# Patient Record
Sex: Male | Born: 1960
Health system: Southern US, Community
[De-identification: ages and names within clinical notes are randomized; demographics above are authoritative.]

## PROBLEM LIST (undated history)

## (undated) DIAGNOSIS — S52202A Unspecified fracture of shaft of left ulna, initial encounter for closed fracture: Secondary | ICD-10-CM

## (undated) DIAGNOSIS — F329 Major depressive disorder, single episode, unspecified: Secondary | ICD-10-CM

## (undated) DIAGNOSIS — R0600 Dyspnea, unspecified: Secondary | ICD-10-CM

## (undated) DIAGNOSIS — I10 Essential (primary) hypertension: Secondary | ICD-10-CM

## (undated) DIAGNOSIS — J449 Chronic obstructive pulmonary disease, unspecified: Secondary | ICD-10-CM

## (undated) DIAGNOSIS — M67432 Ganglion, left wrist: Secondary | ICD-10-CM

## (undated) DIAGNOSIS — M67431 Ganglion, right wrist: Secondary | ICD-10-CM

## (undated) DIAGNOSIS — M5432 Sciatica, left side: Secondary | ICD-10-CM

## (undated) DIAGNOSIS — M1990 Unspecified osteoarthritis, unspecified site: Secondary | ICD-10-CM

## (undated) DIAGNOSIS — S41032A Puncture wound without foreign body of left shoulder, initial encounter: Secondary | ICD-10-CM

## (undated) DIAGNOSIS — T148XXA Other injury of unspecified body region, initial encounter: Secondary | ICD-10-CM

## (undated) DIAGNOSIS — K219 Gastro-esophageal reflux disease without esophagitis: Secondary | ICD-10-CM

## (undated) DIAGNOSIS — E785 Hyperlipidemia, unspecified: Secondary | ICD-10-CM

## (undated) DIAGNOSIS — M51369 Other intervertebral disc degeneration, lumbar region without mention of lumbar back pain or lower extremity pain: Secondary | ICD-10-CM

## (undated) DIAGNOSIS — M5030 Other cervical disc degeneration, unspecified cervical region: Secondary | ICD-10-CM

## (undated) DIAGNOSIS — F32A Depression, unspecified: Secondary | ICD-10-CM

## (undated) HISTORY — PX: HERNIA REPAIR: SHX51

## (undated) HISTORY — PX: CHOLECYSTECTOMY: SHX55

## (undated) HISTORY — PX: FRACTURE SURGERY: SHX138

## (undated) HISTORY — PX: HEMORROIDECTOMY: SUR656

---

## 2005-10-26 ENCOUNTER — Emergency Department: Payer: Self-pay | Admitting: Emergency Medicine

## 2005-11-23 ENCOUNTER — Ambulatory Visit: Payer: Self-pay | Admitting: Vascular Surgery

## 2005-11-23 ENCOUNTER — Other Ambulatory Visit: Payer: Self-pay

## 2005-11-30 ENCOUNTER — Ambulatory Visit: Payer: Self-pay | Admitting: Vascular Surgery

## 2006-09-10 ENCOUNTER — Ambulatory Visit: Payer: Self-pay | Admitting: Pain Medicine

## 2006-09-19 ENCOUNTER — Ambulatory Visit: Payer: Self-pay | Admitting: Pain Medicine

## 2006-10-02 ENCOUNTER — Encounter: Payer: Self-pay | Admitting: Pain Medicine

## 2006-10-03 ENCOUNTER — Ambulatory Visit: Payer: Self-pay | Admitting: Pain Medicine

## 2008-07-17 ENCOUNTER — Ambulatory Visit: Payer: Self-pay

## 2008-07-20 ENCOUNTER — Ambulatory Visit: Payer: Self-pay

## 2008-12-08 ENCOUNTER — Emergency Department: Payer: Self-pay | Admitting: Emergency Medicine

## 2010-08-08 ENCOUNTER — Emergency Department: Payer: Self-pay | Admitting: Emergency Medicine

## 2010-11-08 ENCOUNTER — Emergency Department: Payer: Self-pay | Admitting: Emergency Medicine

## 2011-02-11 ENCOUNTER — Emergency Department: Payer: Self-pay | Admitting: Internal Medicine

## 2011-03-30 ENCOUNTER — Ambulatory Visit: Payer: Self-pay | Admitting: Emergency Medicine

## 2011-03-31 ENCOUNTER — Ambulatory Visit: Payer: Self-pay | Admitting: Emergency Medicine

## 2012-08-20 ENCOUNTER — Ambulatory Visit: Payer: Self-pay | Admitting: Physical Medicine and Rehabilitation

## 2012-09-17 ENCOUNTER — Ambulatory Visit: Payer: Self-pay | Admitting: Internal Medicine

## 2012-09-24 IMAGING — CT CT ABD-PELV W/ CM
1 of 2 series · 15 of 32 positions shown, 19 images · IV contrast (isovue)
Comparison: none

REASON FOR EXAM: (1) LLQ pain; (2) LLQ pain
COMMENTS:

PROCEDURE:     CT  - CT ABDOMEN / PELVIS  W  - August 08, 2010 [DATE]
RESULT:     Comparison:  None.
TECHNIQUE: Multiple axial images of the abdomen and pelvis were performed
from the lung bases to the pubic symphysis, with p.o. contrast and with 100
mL of Isovue 370 intravenous contrast.

[Series 2: soft tissue · axial · 0.93mm/px · z∈[-6,+474]mm · 15 of 176 slices shown, 19 images]
[im 8/176  soft-tissue]
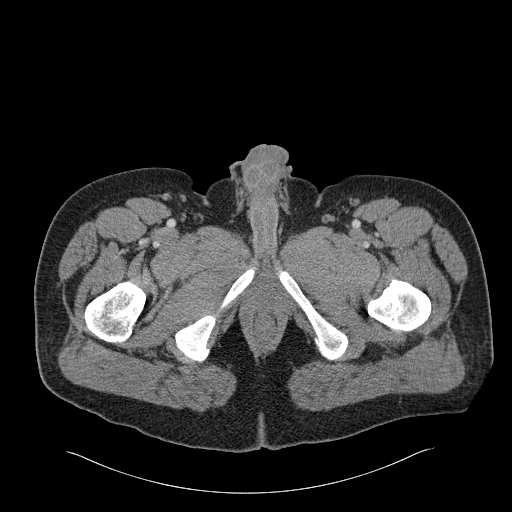
[im 8/176  bone]
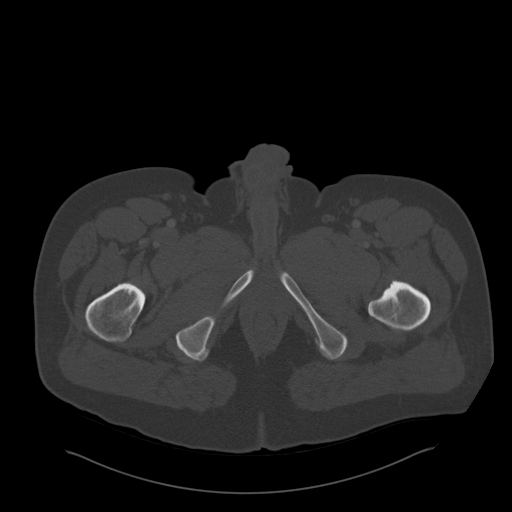
[im 23/176  soft-tissue]
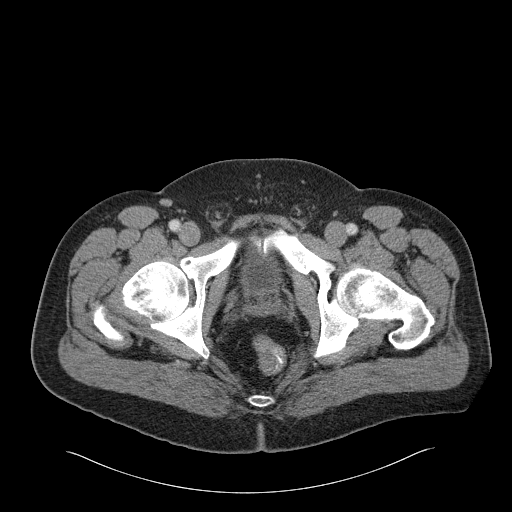
[im 39/176  soft-tissue]
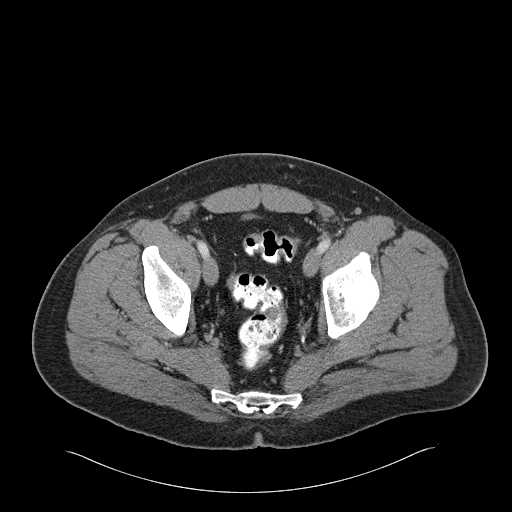
[im 46/176  soft-tissue]
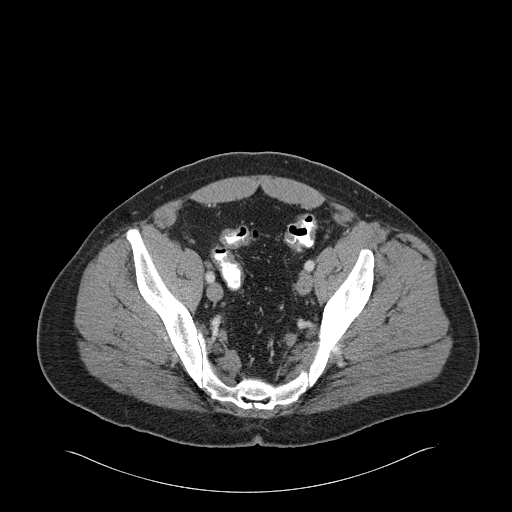
[im 61/176  soft-tissue]
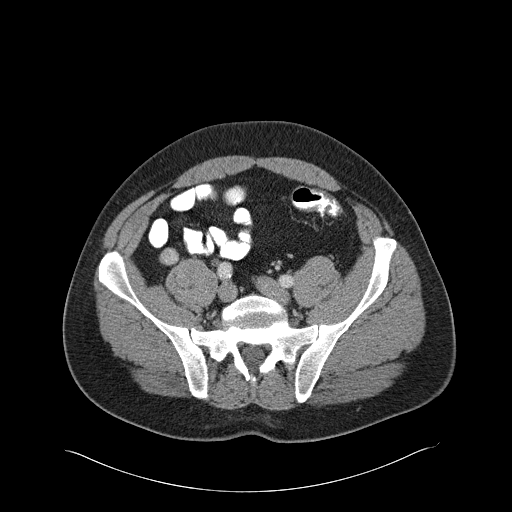
[im 77/176  soft-tissue]
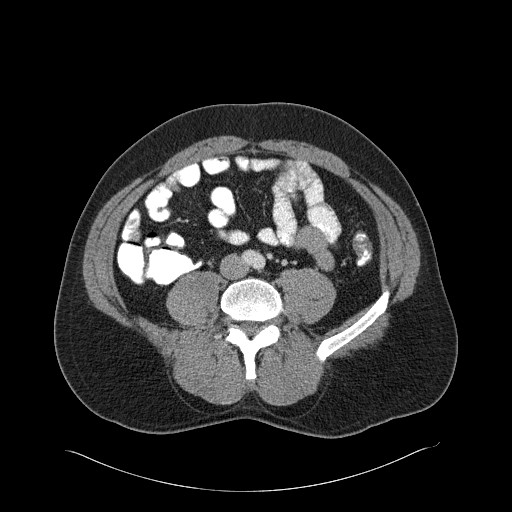
[im 92/176  soft-tissue]
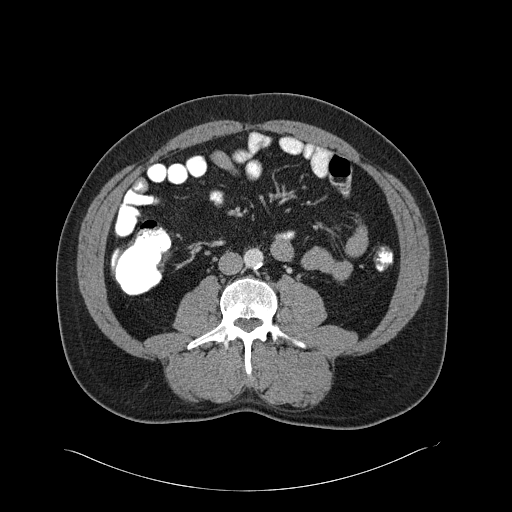
[im 99/176  soft-tissue]
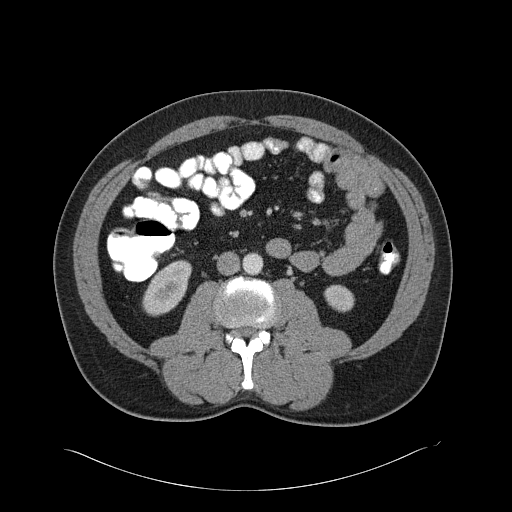
[im 115/176  soft-tissue]
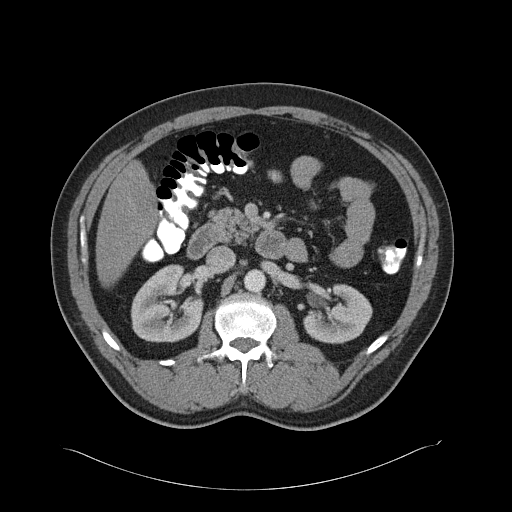
[im 115/176  bone]
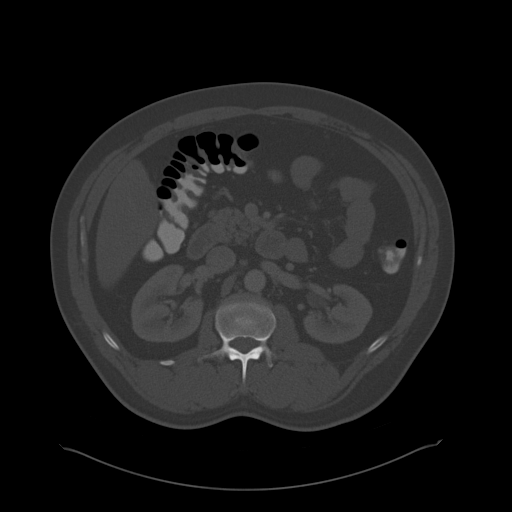
[im 130/176  soft-tissue]
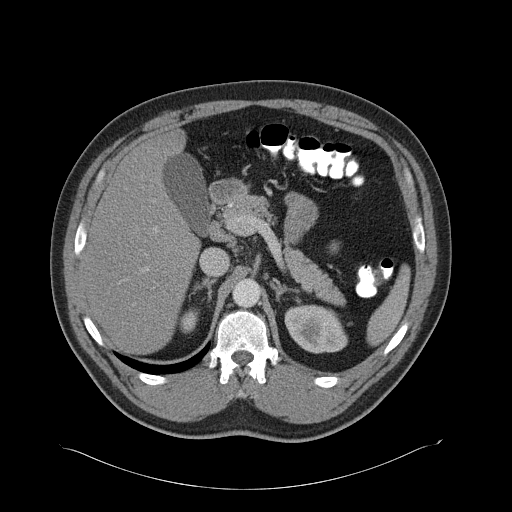
[im 137/176  soft-tissue]
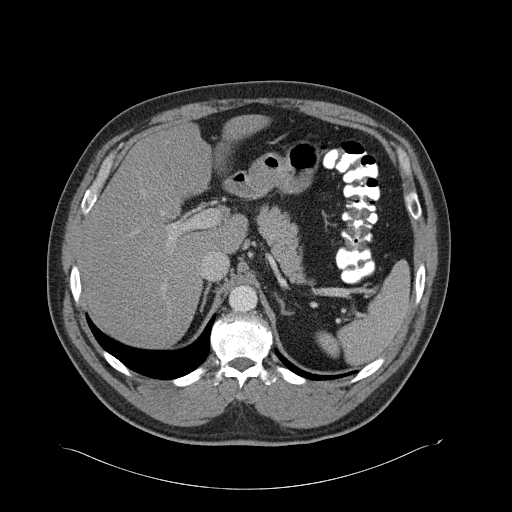
[im 145/176  lung]
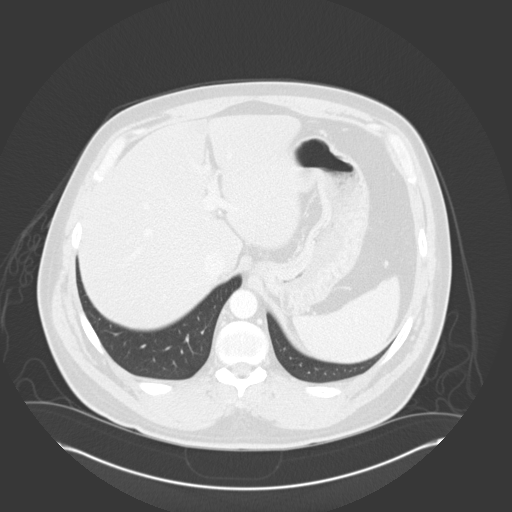
[im 153/176  soft-tissue]
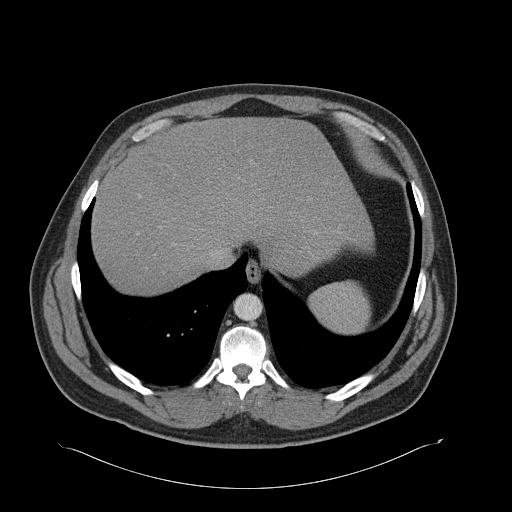
[im 153/176  lung]
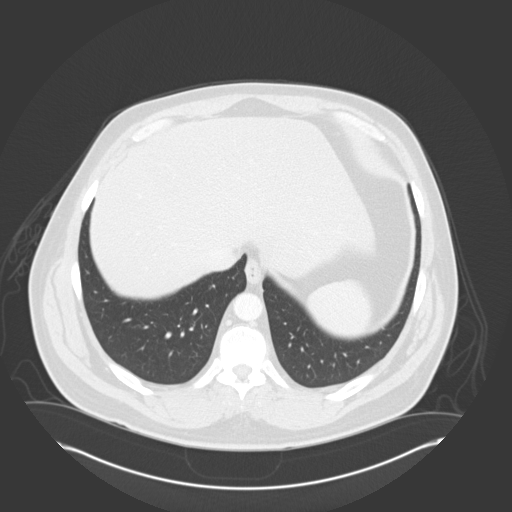
[im 160/176  lung]
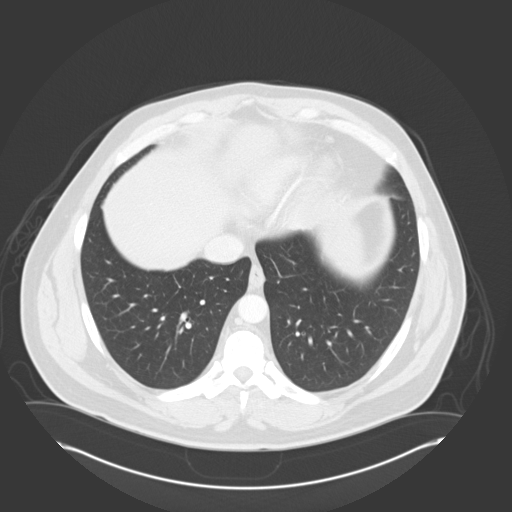
[im 168/176  soft-tissue]
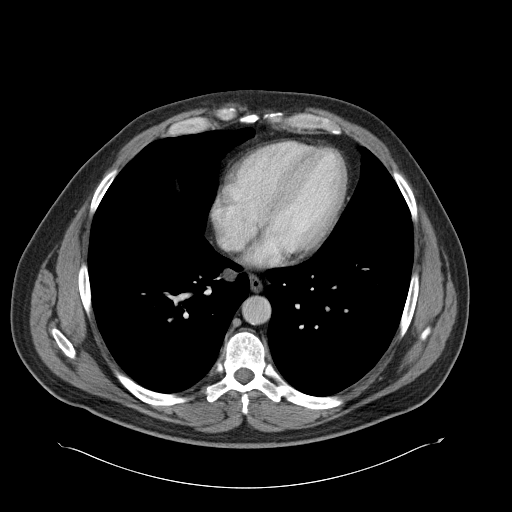
[im 168/176  lung]
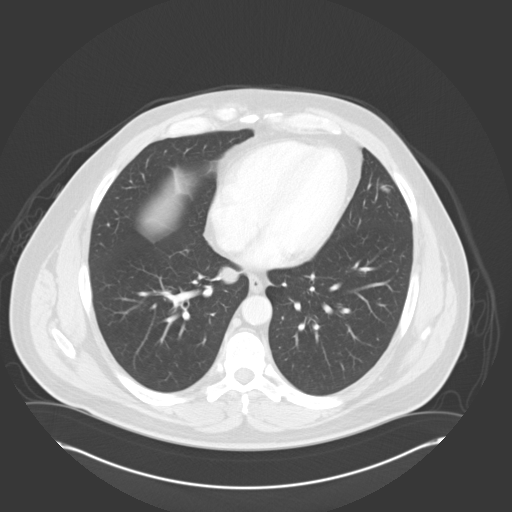

[15 of 32 positions shown; findings below may reference images not displayed]

FINDINGS: The liver is slightly low in attenuation, suggesting hepatic steatosis. Area
of low-attenuation along the falciform ligament likely represents focal
fatty deposition. The gallbladder, spleen, adrenals, and pancreas are
unremarkable. The kidneys enhance normally. The small and large bowel are
normal in caliber. Mild wall thickening of the sigmoid colon is felt to be
related to underdistention. The appendix is normal.

No aggressive lytic or sclerotic osseous lesions identified.
IMPRESSION: No acute findings in the abdomen or pelvis. Mild apparent thickening of the
wall of the sigmoid colon is felt to be secondary to underdistention. It
would be difficult to exclude a very mild or early colitis, but this is felt
less likely.

## 2012-12-17 ENCOUNTER — Ambulatory Visit: Payer: Self-pay | Admitting: Gastroenterology

## 2012-12-18 LAB — PATHOLOGY REPORT

## 2012-12-26 ENCOUNTER — Ambulatory Visit: Payer: Self-pay | Admitting: Surgery

## 2013-11-05 DIAGNOSIS — K219 Gastro-esophageal reflux disease without esophagitis: Secondary | ICD-10-CM | POA: Insufficient documentation

## 2013-11-05 DIAGNOSIS — G8929 Other chronic pain: Secondary | ICD-10-CM | POA: Insufficient documentation

## 2013-11-05 DIAGNOSIS — M5136 Other intervertebral disc degeneration, lumbar region: Secondary | ICD-10-CM | POA: Insufficient documentation

## 2013-11-05 DIAGNOSIS — J449 Chronic obstructive pulmonary disease, unspecified: Secondary | ICD-10-CM | POA: Insufficient documentation

## 2013-11-05 DIAGNOSIS — I10 Essential (primary) hypertension: Secondary | ICD-10-CM | POA: Insufficient documentation

## 2013-11-05 DIAGNOSIS — E785 Hyperlipidemia, unspecified: Secondary | ICD-10-CM | POA: Insufficient documentation

## 2014-04-23 DIAGNOSIS — M5412 Radiculopathy, cervical region: Secondary | ICD-10-CM | POA: Insufficient documentation

## 2014-05-05 ENCOUNTER — Ambulatory Visit: Payer: Self-pay | Admitting: Orthopedic Surgery

## 2014-09-04 NOTE — Op Note (Signed)
PATIENT NAME:  Ryan Price, Ryan Price MR#:  [id number] DATE OF BIRTH:  Dec 10, 1960  DATE OF PROCEDURE:  12/26/2012  PREOPERATIVE DIAGNOSIS: Internal and external hemorrhoids.   POSTOPERATIVE DIAGNOSIS: Internal and external hemorrhoids.   PROCEDURE: Internal and external hemorrhoidectomy.   SURGEON: Rochel Brome, M.D.   ANESTHESIA: General.   INDICATIONS: This 54 year old male has a history of marked bulging of hemorrhoids with much bright red rectal bleeding and large internal and external hemorrhoids were demonstrated on physical exam.  Resection was recommended for definitive treatment.   DESCRIPTION OF PROCEDURE: The patient was placed on the operating table in the supine position under general anesthesia. Legs were elevated into the lithotomy position using ankle straps. The hemorrhoids could be seen bulging. There were essentially circumferential external hemorrhoids. The anal area was prepared with Betadine solution and draped in a sterile manner.   Initially, 5 mL of Exparel was injected in the subcutaneous tissues circumferentially around the external hemorrhoids. Next, the anal canal was dilated large enough to admit 3 fingers. The bivalve anal retractor was introduced demonstrating multiple large internal hemorrhoids. There were 4 internal and external hemorrhoid complexes identified to be excised. These were found at the 2 o'clock, 5 o'clock, 7 o'clock  and 11 o'clock positions.   The individual hemorrhoids were each ligated above the internal component with a 2-0 chromic suture ligature and a V-shaped incision scored externally. The dissection was carried out with electrocautery, dissecting away the external hemorrhoid from the surrounding subcutaneous tissues. Dissection was carried up over the internal anal sphincter up to the previously placed suture ligature, where the hemorrhoid was further ligated and excised. All 4 hemorrhoids were submitted in formalin for routine pathology. Each  wound was closed with running 2-0 chromic locked, tied sutures. As noted during the course of the procedure, a number of small bleeding points were cauterized, and by the end of the procedure hemostasis was intact. It is noted that a small opening was left in the skin at each of the 4 sites left for drainage. Subsequently, after completing all 4 hemorrhoidectomies, the skin was again cleaned with Betadine and infiltrated around the incisions with Exparel and also infiltrated deeper tissues surrounding the sphincter using a total of 20 mL.   Dressings were applied with paper tape. The patient appeared to tolerate the procedure satisfactorily and was then prepared for transfer to the recovery room.     ____________________________ Lenna Sciara. Rochel Brome, MD jws:dmm D: 12/26/2012 10:50:01 ET T: 12/26/2012 11:02:25 ET JOB#: [id number]  cc: Loreli Dollar, MD, <Dictator> Loreli Dollar MD ELECTRONICALLY SIGNED 12/27/2012 19:03

## 2014-10-27 DIAGNOSIS — S52302A Unspecified fracture of shaft of left radius, initial encounter for closed fracture: Secondary | ICD-10-CM

## 2014-10-27 DIAGNOSIS — S52202A Unspecified fracture of shaft of left ulna, initial encounter for closed fracture: Secondary | ICD-10-CM | POA: Insufficient documentation

## 2015-05-26 ENCOUNTER — Emergency Department
Admission: EM | Admit: 2015-05-26 | Discharge: 2015-05-26 | Disposition: A | Discharge status: Home / Self Care | Payer: Medicare Other | Attending: Emergency Medicine | Admitting: Emergency Medicine

## 2015-05-26 ENCOUNTER — Emergency Department: Payer: Medicare Other

## 2015-05-26 DIAGNOSIS — R1031 Right lower quadrant pain: Secondary | ICD-10-CM | POA: Diagnosis not present

## 2015-05-26 DIAGNOSIS — R197 Diarrhea, unspecified: Secondary | ICD-10-CM | POA: Diagnosis not present

## 2015-05-26 DIAGNOSIS — R1110 Vomiting, unspecified: Secondary | ICD-10-CM | POA: Insufficient documentation

## 2015-05-26 HISTORY — DX: Essential (primary) hypertension: I10

## 2015-05-26 LAB — CBC
HEMATOCRIT: 48 % (ref 40.0–52.0)
Hemoglobin: 16.1 g/dL (ref 13.0–18.0)
MCH: 30.5 pg (ref 26.0–34.0)
MCHC: 33.5 g/dL (ref 32.0–36.0)
MCV: 91 fL (ref 80.0–100.0)
Platelets: 242 10*3/uL (ref 150–440)
RBC: 5.27 MIL/uL (ref 4.40–5.90)
RDW: 13.8 % (ref 11.5–14.5)
WBC: 13.6 10*3/uL — AB (ref 3.8–10.6)

## 2015-05-26 LAB — URINALYSIS COMPLETE WITH MICROSCOPIC (ARMC ONLY)
BACTERIA UA: NONE SEEN
Bilirubin Urine: NEGATIVE
Glucose, UA: NEGATIVE mg/dL
HGB URINE DIPSTICK: NEGATIVE
Ketones, ur: NEGATIVE mg/dL
LEUKOCYTES UA: NEGATIVE
Nitrite: NEGATIVE
PH: 5 (ref 5.0–8.0)
PROTEIN: NEGATIVE mg/dL
SQUAMOUS EPITHELIAL / LPF: NONE SEEN
Specific Gravity, Urine: 1.018 (ref 1.005–1.030)
WBC, UA: NONE SEEN WBC/hpf (ref 0–5)

## 2015-05-26 LAB — COMPREHENSIVE METABOLIC PANEL
ALT: 28 U/L (ref 17–63)
AST: 21 U/L (ref 15–41)
Albumin: 4.7 g/dL (ref 3.5–5.0)
Alkaline Phosphatase: 51 U/L (ref 38–126)
Anion gap: 5 (ref 5–15)
BUN: 13 mg/dL (ref 6–20)
CHLORIDE: 112 mmol/L — AB (ref 101–111)
CO2: 21 mmol/L — AB (ref 22–32)
Calcium: 9.1 mg/dL (ref 8.9–10.3)
Creatinine, Ser: 0.81 mg/dL (ref 0.61–1.24)
GFR calc Af Amer: >60 mL/min (ref >60)
Glucose, Bld: 117 mg/dL — ABNORMAL HIGH (ref 65–99)
POTASSIUM: 4.1 mmol/L (ref 3.5–5.1)
SODIUM: 138 mmol/L (ref 135–145)
Total Bilirubin: 0.5 mg/dL (ref 0.3–1.2)
Total Protein: 8.2 g/dL — ABNORMAL HIGH (ref 6.5–8.1)

## 2015-05-26 LAB — LIPASE, BLOOD: LIPASE: 17 U/L (ref 11–51)

## 2015-05-26 MED ORDER — IOHEXOL 350 MG/ML SOLN
100.0000 mL | Freq: Once | INTRAVENOUS | Status: AC | PRN
Start: 2015-05-26 — End: 2015-05-26
  Administered 2015-05-26: 100 mL via INTRAVENOUS
  Filled 2015-05-26: qty 100

## 2015-05-26 MED ORDER — IOHEXOL 240 MG/ML SOLN
25.0000 mL | Freq: Once | INTRAMUSCULAR | Status: AC | PRN
  Administered 2015-05-26: 25 mL via ORAL
  Filled 2015-05-26: qty 25

## 2015-05-26 MED ORDER — SODIUM CHLORIDE 0.9 % IV SOLN
Freq: Once | INTRAVENOUS | Status: AC
  Administered 2015-05-26: 20:00:00 via INTRAVENOUS

## 2015-05-26 MED ORDER — MORPHINE SULFATE (PF) 4 MG/ML IV SOLN
4.0000 mg | Freq: Once | INTRAVENOUS | Status: AC
  Administered 2015-05-26: 4 mg via INTRAVENOUS
  Filled 2015-05-26: qty 1

## 2015-05-26 NOTE — Discharge Instructions (Signed)

## 2015-05-26 NOTE — ED Provider Notes (Signed)
[facility] Emergency Department Provider Note     Time seen: ----------------------------------------- 7:05 PM on 05/26/2015 -----------------------------------------    I have reviewed the triage vital signs and the nursing notes.   HISTORY  Chief Complaint Abdominal Pain    HPI Ryan Price. is a 55 y.o. male who presents ER with right lower quadrant abdominal pain diarrhea nausea that began last night. Patient states the pain began worsening today, describes it as sharp. Nothing seems to make it better, movement makes it worse. He does not have a history of abdominal pain   History reviewed. No pertinent past medical history.  There are no active problems to display for this patient.   History reviewed. No pertinent past surgical history.  Allergies Review of patient's allergies indicates no known allergies.  Social History Social History  Substance Use Topics  . Smoking status: Never Smoker   . Smokeless tobacco: None  . Alcohol Use: Yes    Review of Systems Constitutional: Negative for fever. Eyes: Negative for visual changes. ENT: Negative for sore throat. Cardiovascular: Negative for chest pain. Respiratory: Negative for shortness of breath. Gastrointestinal: Positive for abdominal pain, vomiting and diarrhea Genitourinary: Negative for dysuria. Musculoskeletal: Negative for back pain. Skin: Negative for rash. Neurological: Negative for headaches, focal weakness or numbness.  10-point ROS otherwise negative.  ____________________________________________   PHYSICAL EXAM:  VITAL SIGNS: ED Triage Vitals  Enc Vitals Group     BP 05/26/15 1802 125/91 mmHg     Pulse Rate 05/26/15 1759 111     Resp 05/26/15 1759 18     Temp 05/26/15 1759 98.9 F (37.2 C)     Temp Source 05/26/15 1759 Oral     SpO2 05/26/15 1759 97 %     Weight 05/26/15 1759 245 lb (111.131 kg)     Height 05/26/15 1759 6\' 2"  (1.88 m)   Head Cir --      Peak Flow --      Pain Score 05/26/15 1800 7     Pain Loc --      Pain Edu? --      Excl. in [place]? --     Constitutional: Alert and oriented. Well appearing, mild distress Eyes: Conjunctivae are normal. PERRL. Normal extraocular movements. ENT   Head: Normocephalic and atraumatic.   Nose: No congestion/rhinnorhea.   Mouth/Throat: Mucous membranes are moist.   Neck: No stridor. Cardiovascular: Normal rate, regular rhythm. Normal and symmetric distal pulses are present in all extremities. No murmurs, rubs, or gallops. Respiratory: Normal respiratory effort without tachypnea nor retractions. Breath sounds are clear and equal bilaterally. No wheezes/rales/rhonchi. Gastrointestinal: Right lower quadrant tenderness, positive rebound, positive pain in McBurney's point. Hypoactive bowel sounds. Musculoskeletal: Nontender with normal range of motion in all extremities. No joint effusions.  No lower extremity tenderness nor edema. Neurologic:  Normal speech and language. No gross focal neurologic deficits are appreciated. Speech is normal. No gait instability. Skin:  Skin is warm, dry and intact. No rash noted. Psychiatric: Mood and affect are normal. Speech and behavior are normal. Patient exhibits appropriate insight and judgment.  ____________________________________________  ED COURSE:  Pertinent labs & imaging results that were available during my care of the patient were reviewed by me and considered in my medical decision making (see chart for details). Patient clinically with appendicitis. Will discuss with general surgery and likely do CT. ____________________________________________    LABS (pertinent positives/negatives)  Labs Reviewed  COMPREHENSIVE METABOLIC PANEL - Abnormal; Notable for the  following:    Chloride 112 (*)    CO2 21 (*)    Glucose, Bld 117 (*)    Total Protein 8.2 (*)    All other components within normal limits  CBC - Abnormal;  Notable for the following:    WBC 13.6 (*)    All other components within normal limits  URINALYSIS COMPLETEWITH MICROSCOPIC ([facility] ONLY) - Abnormal; Notable for the following:    Color, Urine YELLOW (*)    APPearance CLEAR (*)    All other components within normal limits  LIPASE, BLOOD    RADIOLOGY Images were viewed by me  CT abdomen and pelvis IMPRESSION: 1. Moderate fluid throughout the colon typically seen with diarrhea. No obvious colonic wall thickening or abnormal enhancement. 2. The terminal ileum and appendix are normal. 3. Status post cholecystectomy. No biliary dilatation. 4. No abdominal/pelvic mass or lymphadenopathy. ____________________________________________  FINAL ASSESSMENT AND PLAN  Right lower quadrant pain, diarrhea  Plan: Patient with labs and imaging as dictated above. Patient likely with viral etiology. Advised Imodium for diarrhea, probiotics and increase fiber intake. He is stable for outpatient follow-up with his doctor.   Earleen Newport, MD   Earleen Newport, MD 05/26/15 [id number]

## 2015-05-26 NOTE — ED Notes (Signed)
RLQ abdominal pain, diarrhea, nausea that began last night. Pt states pain worsening today. No appetite. Pt alert and oriented X4, active, cooperative, pt in NAD. RR even and unlabored, color WNL.

## 2016-04-26 ENCOUNTER — Encounter
Admission: RE | Admit: 2016-04-26 | Discharge: 2016-04-26 | Disposition: A | Discharge status: Home / Self Care | Payer: Medicare Other | Source: Ambulatory Visit | Attending: Podiatry | Admitting: Podiatry

## 2016-04-26 DIAGNOSIS — F17200 Nicotine dependence, unspecified, uncomplicated: Secondary | ICD-10-CM | POA: Diagnosis not present

## 2016-04-26 DIAGNOSIS — K219 Gastro-esophageal reflux disease without esophagitis: Secondary | ICD-10-CM | POA: Diagnosis not present

## 2016-04-26 DIAGNOSIS — Z01810 Encounter for preprocedural cardiovascular examination: Secondary | ICD-10-CM

## 2016-04-26 DIAGNOSIS — I10 Essential (primary) hypertension: Secondary | ICD-10-CM | POA: Diagnosis not present

## 2016-04-26 DIAGNOSIS — Z01812 Encounter for preprocedural laboratory examination: Secondary | ICD-10-CM | POA: Insufficient documentation

## 2016-04-26 DIAGNOSIS — D1631 Benign neoplasm of short bones of right lower limb: Secondary | ICD-10-CM | POA: Diagnosis not present

## 2016-04-26 DIAGNOSIS — M205X1 Other deformities of toe(s) (acquired), right foot: Secondary | ICD-10-CM | POA: Diagnosis present

## 2016-04-26 HISTORY — DX: Gastro-esophageal reflux disease without esophagitis: K21.9

## 2016-04-26 LAB — HEMOGLOBIN: Hemoglobin: 13.9 g/dL (ref 13.0–18.0)

## 2016-04-26 NOTE — Patient Instructions (Signed)
Your procedure is scheduled on: Friday 04/28/16 Report to [facility]. 2ND FLOOR MEDICAL MALL ENTRANCE. To find out your arrival time please call [phone] between 1PM - 3PM on Thursday 04/27/16.  Remember: Instructions that are not followed completely may result in serious medical risk, up to and including death, or upon the discretion of your surgeon and anesthesiologist your surgery may need to be rescheduled.    __X__ 1. Do not eat food or drink liquids after midnight. No gum chewing or hard candies.     __X__ 2. No Alcohol for 24 hours before or after surgery.   ____ 3. Bring all medications with you on the day of surgery if instructed.    __X__ 4. Notify your doctor if there is any change in your medical condition     (cold, fever, infections).             ___X__5. No smoking within 24 hours of your surgery.     Do not wear jewelry, make-up, hairpins, clips or nail polish.  Do not wear lotions, powders, or perfumes.   Do not shave 48 hours prior to surgery. Men may shave face and neck.  Do not bring valuables to the hospital.    [facility] is not responsible for any belongings or valuables.               Contacts, dentures or bridgework may not be worn into surgery.  Leave your suitcase in the car. After surgery it may be brought to your room.  For patients admitted to the hospital, discharge time is determined by your                treatment team.   Patients discharged the day of surgery will not be allowed to drive home.   Please read over the following fact sheets that you were given:   Pain Booklet and MRSA Information   __X__ Take these medicines the morning of surgery with A SIP OF WATER:    1. BYSTOLIC  2. LISINOPRIL  3. [place]  4.  5.  6.  ____ Fleet Enema (as directed)   __X__ Use CHG Soap as directed  ____ Use inhalers on the day of surgery  ____ Stop metformin 2 days prior to surgery    ____ Take 1/2 of usual insulin dose the night before  surgery and none on the morning of surgery.   ____ Stop Coumadin/Plavix/aspirin on   __X__ Stop Anti-inflammatories such as Advil, Aleve, Ibuprofen, Motrin, Naproxen, Naprosyn, Goodies,powder, or aspirin products.  OK to take Tylenol.   ____ Stop supplements until after surgery.    ____ Bring C-Pap to the hospital.

## 2016-04-28 ENCOUNTER — Encounter
Admission: RE | Disposition: A | Discharge status: Home / Self Care | Payer: Self-pay | Source: Ambulatory Visit | Attending: Podiatry

## 2016-04-28 ENCOUNTER — Ambulatory Visit: Payer: Medicare Other | Admitting: Anesthesiology

## 2016-04-28 ENCOUNTER — Ambulatory Visit
Admission: RE | Admit: 2016-04-28 | Discharge: 2016-04-28 | Disposition: A | Discharge status: Home / Self Care | Payer: Medicare Other | Source: Ambulatory Visit | Attending: Podiatry | Admitting: Podiatry

## 2016-04-28 ENCOUNTER — Encounter: Payer: Self-pay | Admitting: Podiatry

## 2016-04-28 DIAGNOSIS — D1631 Benign neoplasm of short bones of right lower limb: Secondary | ICD-10-CM | POA: Insufficient documentation

## 2016-04-28 DIAGNOSIS — K219 Gastro-esophageal reflux disease without esophagitis: Secondary | ICD-10-CM | POA: Insufficient documentation

## 2016-04-28 DIAGNOSIS — M205X1 Other deformities of toe(s) (acquired), right foot: Secondary | ICD-10-CM | POA: Insufficient documentation

## 2016-04-28 DIAGNOSIS — F17200 Nicotine dependence, unspecified, uncomplicated: Secondary | ICD-10-CM | POA: Insufficient documentation

## 2016-04-28 DIAGNOSIS — I10 Essential (primary) hypertension: Secondary | ICD-10-CM | POA: Insufficient documentation

## 2016-04-28 HISTORY — PX: HALLUX VALGUS CHEILECTOMY: SHX6625

## 2016-04-28 SURGERY — CHEILECTOMY, GREAT TOE, WITH IMPLANT INSERTION
Anesthesia: General | Site: Foot | Laterality: Right | Wound class: Clean

## 2016-04-28 MED ORDER — OXYCODONE HCL 5 MG PO TABS
ORAL_TABLET | ORAL | Status: AC
  Filled 2016-04-28: qty 1

## 2016-04-28 MED ORDER — OXYCODONE HCL 5 MG PO TABS
5.0000 mg | ORAL_TABLET | Freq: Once | ORAL | Status: AC | PRN
  Administered 2016-04-28: 5 mg via ORAL

## 2016-04-28 MED ORDER — LIDOCAINE HCL (PF) 1 % IJ SOLN
INTRAMUSCULAR | Status: AC
  Filled 2016-04-28: qty 30

## 2016-04-28 MED ORDER — OXYCODONE-ACETAMINOPHEN 5-325 MG PO TABS: 1.0000 | ORAL_TABLET | ORAL | 0 refills | Status: DC | PRN

## 2016-04-28 MED ORDER — BUPIVACAINE HCL (PF) 0.5 % IJ SOLN
INTRAMUSCULAR | Status: AC
  Filled 2016-04-28: qty 30

## 2016-04-28 MED ORDER — MEPERIDINE HCL 25 MG/ML IJ SOLN: 6.2500 mg | INTRAMUSCULAR | Status: DC | PRN

## 2016-04-28 MED ORDER — FENTANYL CITRATE (PF) 100 MCG/2ML IJ SOLN
INTRAMUSCULAR | Status: DC | PRN
  Administered 2016-04-28: 50 ug via INTRAVENOUS

## 2016-04-28 MED ORDER — DEXAMETHASONE SODIUM PHOSPHATE 10 MG/ML IJ SOLN
INTRAMUSCULAR | Status: DC | PRN
  Administered 2016-04-28: 10 mg via INTRAVENOUS

## 2016-04-28 MED ORDER — PROMETHAZINE HCL 25 MG/ML IJ SOLN: 6.2500 mg | INTRAMUSCULAR | Status: DC | PRN

## 2016-04-28 MED ORDER — SUGAMMADEX SODIUM 200 MG/2ML IV SOLN
INTRAVENOUS | Status: DC | PRN
  Administered 2016-04-28: 200 mg via INTRAVENOUS

## 2016-04-28 MED ORDER — OXYCODONE HCL 5 MG/5ML PO SOLN: 5.0000 mg | Freq: Once | ORAL | Status: AC | PRN

## 2016-04-28 MED ORDER — PROPOFOL 10 MG/ML IV BOLUS
INTRAVENOUS | Status: DC | PRN
  Administered 2016-04-28: 100 mg via INTRAVENOUS
  Administered 2016-04-28: 200 mg via INTRAVENOUS

## 2016-04-28 MED ORDER — FENTANYL CITRATE (PF) 100 MCG/2ML IJ SOLN: 25.0000 ug | INTRAMUSCULAR | Status: DC | PRN

## 2016-04-28 MED ORDER — MIDAZOLAM HCL 2 MG/2ML IJ SOLN
INTRAMUSCULAR | Status: DC | PRN
  Administered 2016-04-28: 2 mg via INTRAVENOUS

## 2016-04-28 MED ORDER — ONDANSETRON HCL 4 MG/2ML IJ SOLN
INTRAMUSCULAR | Status: DC | PRN
  Administered 2016-04-28: 4 mg via INTRAVENOUS

## 2016-04-28 MED ORDER — BUPIVACAINE HCL 0.5 % IJ SOLN
INTRAMUSCULAR | Status: DC | PRN
  Administered 2016-04-28: 5 mL

## 2016-04-28 MED ORDER — LIDOCAINE HCL (CARDIAC) 20 MG/ML IV SOLN
INTRAVENOUS | Status: DC | PRN
Start: 2016-04-28 — End: 2016-04-28
  Administered 2016-04-28: 100 mg via INTRAVENOUS

## 2016-04-28 MED ORDER — LACTATED RINGERS IV SOLN
INTRAVENOUS | Status: DC
  Administered 2016-04-28: 06:00:00 via INTRAVENOUS

## 2016-04-28 MED ORDER — NEOMYCIN-POLYMYXIN B GU 40-200000 IR SOLN
Status: AC
  Filled 2016-04-28: qty 2

## 2016-04-28 MED ORDER — ROCURONIUM BROMIDE 100 MG/10ML IV SOLN
INTRAVENOUS | Status: DC | PRN
  Administered 2016-04-28: 50 mg via INTRAVENOUS

## 2016-04-28 MED ORDER — EPHEDRINE SULFATE 50 MG/ML IJ SOLN
INTRAMUSCULAR | Status: DC | PRN
  Administered 2016-04-28: 10 mg via INTRAVENOUS

## 2016-04-28 SURGICAL SUPPLY — 45 items
BAG COUNTER SPONGE EZ (MISCELLANEOUS) IMPLANT
BANDAGE ELASTIC 4 LF NS (GAUZE/BANDAGES/DRESSINGS) ×3 IMPLANT
BANDAGE STRETCH 3X4.1 STRL (GAUZE/BANDAGES/DRESSINGS) ×3 IMPLANT
BLADE MED AGGRESSIVE (BLADE) ×3 IMPLANT
BLADE SURG 15 STRL LF DISP TIS (BLADE) ×2 IMPLANT
BLADE SURG 15 STRL SS (BLADE) ×4
BLADE SURG MINI STRL (BLADE) ×3 IMPLANT
BNDG ESMARK 4X12 TAN STRL LF (GAUZE/BANDAGES/DRESSINGS) ×3 IMPLANT
BNDG GAUZE 4.5X4.1 6PLY STRL (MISCELLANEOUS) ×3 IMPLANT
CLOSURE WOUND 1/4X4 (GAUZE/BANDAGES/DRESSINGS) ×1
COUNTER SPONGE BAG EZ (MISCELLANEOUS)
CUFF TOURN 18 STER (MISCELLANEOUS) ×3 IMPLANT
CUFF TOURN DUAL PL 12 NO SLV (MISCELLANEOUS) IMPLANT
DRAPE FLUOR MINI C-ARM 54X84 (DRAPES) ×3 IMPLANT
DURAPREP 26ML APPLICATOR (WOUND CARE) ×3 IMPLANT
ELECT REM PT RETURN 9FT ADLT (ELECTROSURGICAL) ×3
ELECTRODE REM PT RTRN 9FT ADLT (ELECTROSURGICAL) ×1 IMPLANT
GAUZE PETRO XEROFOAM 1X8 (MISCELLANEOUS) ×3 IMPLANT
GAUZE SPONGE 4X4 12PLY STRL (GAUZE/BANDAGES/DRESSINGS) ×3 IMPLANT
GAUZE STRETCH 2X75IN STRL (MISCELLANEOUS) ×3 IMPLANT
GLOVE BIO SURGEON STRL SZ7.5 (GLOVE) ×3 IMPLANT
GLOVE INDICATOR 8.0 STRL GRN (GLOVE) ×3 IMPLANT
GOWN STRL REUS W/ TWL LRG LVL3 (GOWN DISPOSABLE) ×2 IMPLANT
GOWN STRL REUS W/TWL LRG LVL3 (GOWN DISPOSABLE) ×4
LABEL OR SOLS (LABEL) ×3 IMPLANT
NEEDLE FILTER BLUNT 18X 1/2SAF (NEEDLE) ×2
NEEDLE FILTER BLUNT 18X1 1/2 (NEEDLE) ×1 IMPLANT
NEEDLE HYPO 25X1 1.5 SAFETY (NEEDLE) ×6 IMPLANT
NS IRRIG 500ML POUR BTL (IV SOLUTION) ×3 IMPLANT
PACK EXTREMITY ARMC (MISCELLANEOUS) ×3 IMPLANT
PAD CAST CTTN 4X4 STRL (SOFTGOODS) ×1 IMPLANT
PADDING CAST COTTON 4X4 STRL (SOFTGOODS) ×2
PENCIL ELECTRO HAND CTR (MISCELLANEOUS) ×3 IMPLANT
RASP SM TEAR CROSS CUT (RASP) ×3 IMPLANT
SOL PREP PVP 2OZ (MISCELLANEOUS) ×3
SOLUTION PREP PVP 2OZ (MISCELLANEOUS) ×1 IMPLANT
SPLINT FAST PLASTER 5X30 (CAST SUPPLIES) ×2
SPLINT PLASTER CAST FAST 5X30 (CAST SUPPLIES) ×1 IMPLANT
STOCKINETTE STRL 6IN 960660 (GAUZE/BANDAGES/DRESSINGS) ×3 IMPLANT
STRAP SAFETY BODY (MISCELLANEOUS) ×3 IMPLANT
STRIP CLOSURE SKIN 1/4X4 (GAUZE/BANDAGES/DRESSINGS) ×2 IMPLANT
SUT ETHILON 5-0 FS-2 18 BLK (SUTURE) ×3 IMPLANT
SUT VIC AB 4-0 FS2 27 (SUTURE) ×3 IMPLANT
SYRINGE 10CC LL (SYRINGE) ×6 IMPLANT
WIRE Z .045 C-WIRE SPADE TIP (WIRE) ×3 IMPLANT

## 2016-04-28 NOTE — Transfer of Care (Signed)
Immediate Anesthesia Transfer of Care Note  Patient: Ryan Price.  Procedure(s) Performed: Procedure(s): HALLUX VALGUS CHEILECTOMY/right foot (Right)  Patient Location: PACU  Anesthesia Type:General  Level of Consciousness: awake, alert , oriented and patient cooperative  Airway & Oxygen Therapy: Patient Spontanous Breathing and Patient connected to face mask oxygen  Post-op Assessment: Report given to RN, Post -op Vital signs reviewed and stable and Patient moving all extremities X 4  Post vital signs: Reviewed and stable  Last Vitals:  Vitals:   04/28/16 0604  BP: 138/83  Pulse: 64  Resp: 16  Temp: 36.9 C    Last Pain:  Vitals:   04/28/16 0604  TempSrc: Oral         Complications: No apparent anesthesia complications

## 2016-04-28 NOTE — Discharge Instructions (Addendum)
1. Elevate the right lower extremity on 2 pillows.  2. Keep the bandage on the right foot clean, dry, and do not remove.  3. Sponge bathe only right lower extremity.  4. Wear surgical shoe on the right foot whenever walking or standing.  5. Take one pain pill, Percocet, every 4 hours if needed for pain  AMBULATORY SURGERY  DISCHARGE INSTRUCTIONS   1) The drugs that you were given will stay in your system until tomorrow so for the next 24 hours you should not:  A) Drive an automobile B) Make any legal decisions C) Drink any alcoholic beverage   2) You may resume regular meals tomorrow.  Today it is better to start with liquids and gradually work up to solid foods.  You may eat anything you prefer, but it is better to start with liquids, then soup and crackers, and gradually work up to solid foods.   3) Please notify your doctor immediately if you have any unusual bleeding, trouble breathing, redness and pain at the surgery site, drainage, fever, or pain not relieved by medication.    4) Additional Instructions:        Please contact your physician with any problems or Same Day Surgery at [phone], Monday through Friday 6 am to 4 pm, or [place] at [facility] number at [phone].

## 2016-04-28 NOTE — Op Note (Signed)
Date of operation: 04/28/2016.  Surgeon: Durward Fortes DPM.  Preoperative diagnosis: Hallux limitus with exostosis right great toe joint.  Postoperative diagnosis: Same.  Procedure: Cheilectomy right great toe joint.  Anesthesia: Gen. endotracheal.  Hemostasis: Pneumatic tourniquet right ankle 250 mmHg.  Estimated blood loss: Less than 5 cc.  Pathology: None.  Complications: None apparent.  Operative indications: This is a 55 year old male with some chronic pain in his right great toe joint. Spurs with a fracture fragment off of the base of the great toe were noted and decision was made for surgical intervention to clean up the joint.  Operative procedure: Patient was taken the operating room and placed on the table in the supine position. Following satisfactory general anesthesia the right foot was prepped and draped with 10 cc of 0.5% bupivacaine plain around the great toe joint. A pneumatic tourniquet was applied at the level of the right ankle and the foot was prepped and draped in the usual sterile fashion. The foot was exsanguinated and the tourniquet was inflated to 250 mmHg. Attention was then directed to the dorsal aspect of the right foot where an approximate 5 cm linear incision was made coursing proximal to distal over the first metatarsal and metatarsophalangeal joint. Incision was deepened via sharp and blunt dissection with care taken to retract and coagulate neurovascular structures. A capsular incision was then made over the joint and the capsular and periosteal tissues reflected off of the dorsal and medial head of the first metatarsal. Significant dorsal spurring was noted off of the first metatarsal with a loose fracture fragment off of the dorsal base of the proximal phalanx. The medial eminence was resected using a sagittal saw. The dorsal fracture fragment off of the great toe was then removed sharply using a ronguer. Using a sagittal saw the dorsal spur and prominence  off of the first metatarsal head was then transected and removed in toto. There was noted be some significant loss of cartilage along the dorsal and medial aspect of the first metatarsal head. Multiple drill holes were then placed into the exposed bone at the first metatarsal head using a 0.045 inch K wire. All raw bony edges were rasped smooth. There is noted to be much better range of motion at the great toe joint. Intraoperative FluoroScan views were taken which revealed good joint space at the first MTPJ with good resection of the bony prominences dorsally. The wound was then flushed with copious amounts of sterile saline and closed using 4-0 Vicryl running suture for all layers from capsular and periosteal closure through deep and superficial subcutaneous closure and followed by skin closure. Tincture benzoin and Steri-Strips applied followed by Xeroform and a sterile gauze bandage. Tourniquet was released and blood flow noted to return immediately to the right foot and all digits. Patient tolerated the procedure and anesthesia well and was transported to the PACU with vital signs stable and in good condition.

## 2016-04-28 NOTE — Anesthesia Postprocedure Evaluation (Signed)
Anesthesia Post Note  Patient: Ryan Price.  Procedure(s) Performed: Procedure(s) (LRB): HALLUX VALGUS CHEILECTOMY/right foot (Right)  Patient location during evaluation: PACU Anesthesia Type: General Level of consciousness: awake and alert and oriented Pain management: pain level controlled Vital Signs Assessment: post-procedure vital signs reviewed and stable Respiratory status: spontaneous breathing, nonlabored ventilation and respiratory function stable Cardiovascular status: blood pressure returned to baseline and stable Postop Assessment: no signs of nausea or vomiting Anesthetic complications: no    Last Vitals:  Vitals:   04/28/16 0918 04/28/16 0931  BP: 104/69 104/69  Pulse: 84 81  Resp: (!) 21 16  Temp:  36.8 C    Last Pain:  Vitals:   04/28/16 0931  TempSrc:   PainSc: 2                  Latavious Bitter

## 2016-04-28 NOTE — Interval H&P Note (Signed)
History and Physical Interval Note:  04/28/2016 7:07 AM  Ryan Price.  has presented today for surgery, with the diagnosis of M20.5X1  The various methods of treatment have been discussed with the patient and family. After consideration of risks, benefits and other options for treatment, the patient has consented to  Procedure(s): HALLUX VALGUS CHEILECTOMY/right foot (Right) as a surgical intervention .  The patient's history has been reviewed, patient examined, no change in status, stable for surgery.  I have reviewed the patient's chart and labs.  Questions were answered to the patient's satisfaction.     Durward Fortes

## 2016-04-28 NOTE — Anesthesia Preprocedure Evaluation (Signed)
Anesthesia Evaluation  Patient identified by MRN, date of birth, ID band Patient awake    Reviewed: Allergy & Precautions, NPO status , Patient's Chart, lab work & pertinent test results  History of Anesthesia Complications Negative for: history of anesthetic complications  Airway Mallampati: II  TM Distance: >3 FB Neck ROM: Full    Dental  (+) Partial Upper   Pulmonary neg sleep apnea, neg COPD, Current Smoker,    breath sounds clear to auscultation- rhonchi (-) wheezing      Cardiovascular hypertension, Pt. on medications (-) CAD and (-) Past MI  Rhythm:Regular Rate:Normal - Systolic murmurs and - Diastolic murmurs    Neuro/Psych negative neurological ROS  negative psych ROS   GI/Hepatic Neg liver ROS, GERD  ,  Endo/Other  negative endocrine ROSneg diabetes  Renal/GU negative Renal ROS     Musculoskeletal negative musculoskeletal ROS (+)   Abdominal (+) + obese,   Peds  Hematology negative hematology ROS (+)   Anesthesia Other Findings Past Medical History: No date: GERD (gastroesophageal reflux disease) No date: Hypertension   Reproductive/Obstetrics                             Anesthesia Physical Anesthesia Plan  ASA: III  Anesthesia Plan: General   Post-op Pain Management:    Induction: Intravenous  Airway Management Planned: Oral ETT  Additional Equipment:   Intra-op Plan:   Post-operative Plan:   Informed Consent: I have reviewed the patients History and Physical, chart, labs and discussed the procedure including the risks, benefits and alternatives for the proposed anesthesia with the patient or authorized representative who has indicated his/her understanding and acceptance.   Dental advisory given  Plan Discussed with: CRNA and Anesthesiologist  Anesthesia Plan Comments:        Anesthesia Quick Evaluation

## 2016-04-28 NOTE — H&P (Signed)
Medical history and physical in the chart was reviewed. No changes. Patient stable for surgery

## 2016-04-28 NOTE — Anesthesia Procedure Notes (Signed)
Procedure Name: Intubation Date/Time: 04/28/2016 7:42 AM Performed by: Silvana Newness Pre-anesthesia Checklist: Patient identified, Emergency Drugs available, Suction available, Patient being monitored and Timeout performed Patient Re-evaluated:Patient Re-evaluated prior to inductionOxygen Delivery Method: Circle system utilized Preoxygenation: Pre-oxygenation with 100% oxygen Intubation Type: IV induction Ventilation: Mask ventilation without difficulty Laryngoscope Size: Mac and 4 Grade View: Grade I Tube type: Oral Tube size: 7.5 mm Number of attempts: 1 Airway Equipment and Method: Stylet Placement Confirmation: ETT inserted through vocal cords under direct vision,  positive ETCO2 and breath sounds checked- equal and bilateral Secured at: 23 cm Tube secured with: Tape Dental Injury: Teeth and Oropharynx as per pre-operative assessment

## 2017-05-25 ENCOUNTER — Other Ambulatory Visit: Payer: Self-pay | Admitting: Podiatry

## 2017-05-25 ENCOUNTER — Ambulatory Visit (INDEPENDENT_AMBULATORY_CARE_PROVIDER_SITE_OTHER): Payer: Medicare Other

## 2017-05-25 ENCOUNTER — Encounter: Payer: Self-pay | Admitting: Podiatry

## 2017-05-25 ENCOUNTER — Ambulatory Visit (INDEPENDENT_AMBULATORY_CARE_PROVIDER_SITE_OTHER): Payer: Medicare Other | Admitting: Podiatry

## 2017-05-25 DIAGNOSIS — M2021 Hallux rigidus, right foot: Secondary | ICD-10-CM | POA: Diagnosis not present

## 2017-05-25 DIAGNOSIS — M79671 Pain in right foot: Secondary | ICD-10-CM

## 2017-05-28 ENCOUNTER — Telehealth: Payer: Self-pay | Admitting: *Deleted

## 2017-05-28 DIAGNOSIS — M2021 Hallux rigidus, right foot: Secondary | ICD-10-CM

## 2017-05-28 NOTE — Progress Notes (Signed)
   HPI: 57 year old male presenting today as a new patient with a chief complaint of pain to the first MPJ of the right foot and to the dorsum of the right foot that began approximately 1.5 years ago.  He reports he has had surgery to remove bone spurs in the past and states his foot has hurt worse since.  His surgeon was Dr. Cleda Mccreedy at [facility] 1.5 years ago.  Walking increases the pain.  He denies alleviating factors.  Patient is here for further evaluation and treatment.   Past Medical History:  Diagnosis Date  . GERD (gastroesophageal reflux disease)   . Hypertension      Physical Exam: General: The patient is alert and oriented x3 in no acute distress.  Dermatology: Skin is warm, dry and supple bilateral lower extremities. Negative for open lesions or macerations.  Vascular: Palpable pedal pulses bilaterally. No edema or erythema noted. Capillary refill within normal limits.  Neurological: Epicritic and protective threshold grossly intact bilaterally.   Musculoskeletal Exam: Pain with palpation and range of motion of the right first MPJ. Muscle strength 5/5 in all groups bilateral.   Radiographic Exam:  DJD/degenerative changes of the first MPJ of the right foot.  No fracture or dislocation.  Assessment: -DJD/hallux rigidus right first MPJ   Plan of Care:  -Patient evaluated x-rays reviewed. -Today we discussed the conservative versus surgical management of the presenting pathology. The patient opts for surgical management. All possible complications and details of the procedure were explained. All patient questions were answered. No guarantees were expressed or implied. -Authorization for surgery was initiated today. Surgery will consist of first MPJ arthrodesis right. -Cam boot dispensed. -Return to clinic 1 week postop.   Edrick Kins, DPM [facility]  Dr. Edrick Kins, DPM    [address], [place] [address]                Office [phone]  Fax [phone]

## 2017-05-28 NOTE — Telephone Encounter (Signed)
"  I need to schedule my surgery with Dr. Amalia Hailey.  I saw him on Friday and he said to give you a call."  Dr. Amalia Hailey can do your surgery on February 7.  "February 7 will be fine.  Is there anything else I need to do?"  You can go online and register with the surgical center.  "I can't do stuff like that but my wife can take care of it for me."  Okay, that will be fine.  I will get paperwork from the [place] office.

## 2017-05-30 NOTE — Addendum Note (Signed)
Addended by: Lolita Rieger on: 05/30/2017 10:15 AM   Modules accepted: Orders

## 2017-06-21 ENCOUNTER — Encounter: Payer: Self-pay | Admitting: Podiatry

## 2017-06-21 DIAGNOSIS — M2021 Hallux rigidus, right foot: Secondary | ICD-10-CM | POA: Diagnosis not present

## 2017-06-29 ENCOUNTER — Ambulatory Visit (INDEPENDENT_AMBULATORY_CARE_PROVIDER_SITE_OTHER): Payer: Medicare Other | Admitting: Podiatry

## 2017-06-29 ENCOUNTER — Encounter: Payer: Self-pay | Admitting: Podiatry

## 2017-06-29 ENCOUNTER — Ambulatory Visit (INDEPENDENT_AMBULATORY_CARE_PROVIDER_SITE_OTHER): Payer: Medicare Other

## 2017-06-29 DIAGNOSIS — M2021 Hallux rigidus, right foot: Secondary | ICD-10-CM | POA: Diagnosis not present

## 2017-06-29 DIAGNOSIS — Z9889 Other specified postprocedural states: Secondary | ICD-10-CM

## 2017-07-01 NOTE — Progress Notes (Signed)
   Subjective:  Patient presents today status post right 1st MPJ arthrodesis. DOS: 06/21/17. He states he is doing well. He reports the bandage came off while he was changing his sock but he was able to reapply it himself. He denies any new complaints at this time. Patient is here for further evaluation and treatment.    Past Medical History:  Diagnosis Date  . GERD (gastroesophageal reflux disease)   . Hypertension       Objective/Physical Exam Neurovascular status intact.  Skin incisions appear to be well coapted with sutures and staples intact. No sign of infectious process noted. No dehiscence. No active bleeding noted. Moderate edema noted to the surgical extremity.  Radiographic Exam:  Orthopedic hardware and osteotomies sites appear to be stable with routine healing.  Assessment: 1. s/p right 1st MPJ arthrodesis. DOS: 06/21/17   Plan of Care:  1. Patient was evaluated. X-rays reviewed 2. Dressing changed. Keep clean, dry and intact.  3. Continue nonweightbearing in CAM boot.  4. Return to clinic in one week for suture removal.    Edrick Kins, DPM [facility]  Dr. Edrick Kins, [address]                                        [place], [place] [address]                Office [phone]  Fax [phone]

## 2017-07-06 ENCOUNTER — Encounter: Payer: Self-pay | Admitting: Podiatry

## 2017-07-06 ENCOUNTER — Ambulatory Visit (INDEPENDENT_AMBULATORY_CARE_PROVIDER_SITE_OTHER): Payer: Medicare Other | Admitting: Podiatry

## 2017-07-06 VITALS — BP 138/77 | HR 73 | Temp 99.1°F

## 2017-07-06 DIAGNOSIS — Z9889 Other specified postprocedural states: Secondary | ICD-10-CM

## 2017-07-06 DIAGNOSIS — M2021 Hallux rigidus, right foot: Secondary | ICD-10-CM

## 2017-07-08 NOTE — Progress Notes (Signed)
   Subjective:  Patient presents today status post right 1st MPJ arthrodesis. DOS: 06/21/17. He states the incision looks well. He presented to the office today weightbearing in the CAM boot against post op instructions. He denies any complaints at this time. Patient is here for further evaluation and treatment.    Past Medical History:  Diagnosis Date  . GERD (gastroesophageal reflux disease)   . Hypertension       Objective/Physical Exam Neurovascular status intact.  Skin incisions appear to be well coapted with sutures and staples intact. No sign of infectious process noted. No dehiscence. No active bleeding noted. Moderate edema noted to the surgical extremity.  Assessment: 1. s/p right 1st MPJ arthrodesis. DOS: 06/21/17   Plan of Care:  1. Patient was evaluated.  2. Staples removed. Dry sterile dressing applied.  3. Continue nonweightbearing in CAM boot.  4. Return to clinic in 2 weeks.     Edrick Kins, DPM [facility]  Dr. Edrick Kins, [address]                                        [place], [place] [address]                Office [phone]  Fax [phone]

## 2017-07-20 ENCOUNTER — Ambulatory Visit: Payer: Medicare Other

## 2017-07-20 ENCOUNTER — Encounter: Payer: Medicare Other | Admitting: Podiatry

## 2017-07-23 NOTE — Progress Notes (Signed)
First metatarsal phalangeal joint arthrodesis right foot.

## 2017-07-24 ENCOUNTER — Ambulatory Visit (INDEPENDENT_AMBULATORY_CARE_PROVIDER_SITE_OTHER): Payer: Medicare Other | Admitting: Podiatry

## 2017-07-24 ENCOUNTER — Ambulatory Visit (INDEPENDENT_AMBULATORY_CARE_PROVIDER_SITE_OTHER): Payer: Medicare Other

## 2017-07-24 ENCOUNTER — Encounter: Payer: Self-pay | Admitting: Podiatry

## 2017-07-24 DIAGNOSIS — M2021 Hallux rigidus, right foot: Secondary | ICD-10-CM | POA: Diagnosis not present

## 2017-07-24 DIAGNOSIS — Z9889 Other specified postprocedural states: Secondary | ICD-10-CM | POA: Diagnosis not present

## 2017-07-25 NOTE — Progress Notes (Signed)
   Subjective:  Patient presents today status post right 1st MPJ arthrodesis. DOS: 06/21/17. He states he is doing well. He reports some continued swelling of the foot but has been icing it which helps resolve it. Patient is here for further evaluation and treatment.    Past Medical History:  Diagnosis Date  . GERD (gastroesophageal reflux disease)   . Hypertension       Objective/Physical Exam Neurovascular status intact.  Skin incisions appear to be well coapted. No sign of infectious process noted. No dehiscence. No active bleeding noted. Moderate edema noted to the surgical extremity.  Radiographic Exam:  Orthopedic hardware and osteotomies sites appear to be stable with routine healing.  Assessment: 1. s/p right 1st MPJ arthrodesis. DOS: 06/21/17 - improving    Plan of Care:  1. Patient was evaluated. X-Rays reviewed.  2. Transition from CAM boot to post op shoe. Weightbearing as tolerated x 2 weeks.  3. Post op shoe dispensed.  4. Transition into good sneakers in 2 weeks.  5. Return to clinic in 4 weeks.     Edrick Kins, DPM [facility]  Dr. Edrick Kins, [address]                                        [place], [place] [address]                Office [phone]  Fax [phone]

## 2017-07-27 NOTE — Progress Notes (Signed)
This encounter was created in error - please disregard.

## 2017-08-21 ENCOUNTER — Encounter: Payer: Medicare Other | Admitting: Podiatry

## 2017-08-27 NOTE — Progress Notes (Signed)
This encounter was created in error - please disregard.

## 2017-10-16 ENCOUNTER — Ambulatory Visit: Payer: Medicare Other | Admitting: Anesthesiology

## 2017-10-16 ENCOUNTER — Encounter
Admission: RE | Disposition: A | Discharge status: Home / Self Care | Payer: Self-pay | Source: Ambulatory Visit | Attending: Gastroenterology

## 2017-10-16 ENCOUNTER — Ambulatory Visit
Admission: RE | Admit: 2017-10-16 | Discharge: 2017-10-16 | Disposition: A | Discharge status: Home / Self Care | Payer: Medicare Other | Source: Ambulatory Visit | Attending: Gastroenterology | Admitting: Gastroenterology

## 2017-10-16 DIAGNOSIS — Z79899 Other long term (current) drug therapy: Secondary | ICD-10-CM | POA: Insufficient documentation

## 2017-10-16 DIAGNOSIS — I10 Essential (primary) hypertension: Secondary | ICD-10-CM | POA: Diagnosis not present

## 2017-10-16 DIAGNOSIS — K219 Gastro-esophageal reflux disease without esophagitis: Secondary | ICD-10-CM | POA: Insufficient documentation

## 2017-10-16 DIAGNOSIS — Z86010 Personal history of colonic polyps: Secondary | ICD-10-CM | POA: Insufficient documentation

## 2017-10-16 DIAGNOSIS — Z1211 Encounter for screening for malignant neoplasm of colon: Secondary | ICD-10-CM | POA: Diagnosis not present

## 2017-10-16 DIAGNOSIS — E669 Obesity, unspecified: Secondary | ICD-10-CM | POA: Insufficient documentation

## 2017-10-16 DIAGNOSIS — F329 Major depressive disorder, single episode, unspecified: Secondary | ICD-10-CM | POA: Insufficient documentation

## 2017-10-16 DIAGNOSIS — D123 Benign neoplasm of transverse colon: Secondary | ICD-10-CM | POA: Diagnosis not present

## 2017-10-16 DIAGNOSIS — Z6830 Body mass index (BMI) 30.0-30.9, adult: Secondary | ICD-10-CM | POA: Diagnosis not present

## 2017-10-16 DIAGNOSIS — K5730 Diverticulosis of large intestine without perforation or abscess without bleeding: Secondary | ICD-10-CM | POA: Insufficient documentation

## 2017-10-16 DIAGNOSIS — F17200 Nicotine dependence, unspecified, uncomplicated: Secondary | ICD-10-CM | POA: Diagnosis not present

## 2017-10-16 DIAGNOSIS — Z8371 Family history of colonic polyps: Secondary | ICD-10-CM | POA: Insufficient documentation

## 2017-10-16 DIAGNOSIS — M1990 Unspecified osteoarthritis, unspecified site: Secondary | ICD-10-CM | POA: Insufficient documentation

## 2017-10-16 HISTORY — DX: Unspecified osteoarthritis, unspecified site: M19.90

## 2017-10-16 HISTORY — DX: Depression, unspecified: F32.A

## 2017-10-16 HISTORY — DX: Major depressive disorder, single episode, unspecified: F32.9

## 2017-10-16 HISTORY — PX: COLONOSCOPY WITH PROPOFOL: SHX5780

## 2017-10-16 SURGERY — COLONOSCOPY WITH PROPOFOL
Anesthesia: General

## 2017-10-16 MED ORDER — MIDAZOLAM HCL 2 MG/2ML IJ SOLN
INTRAMUSCULAR | Status: AC
  Filled 2017-10-16: qty 2

## 2017-10-16 MED ORDER — FENTANYL CITRATE (PF) 100 MCG/2ML IJ SOLN
INTRAMUSCULAR | Status: DC | PRN
  Administered 2017-10-16 (×2): 50 ug via INTRAVENOUS

## 2017-10-16 MED ORDER — PROPOFOL 500 MG/50ML IV EMUL
INTRAVENOUS | Status: AC
  Filled 2017-10-16: qty 50

## 2017-10-16 MED ORDER — MIDAZOLAM HCL 2 MG/2ML IJ SOLN
INTRAMUSCULAR | Status: DC | PRN
  Administered 2017-10-16: 2 mg via INTRAVENOUS

## 2017-10-16 MED ORDER — FENTANYL CITRATE (PF) 100 MCG/2ML IJ SOLN
INTRAMUSCULAR | Status: AC
  Filled 2017-10-16: qty 2

## 2017-10-16 MED ORDER — SODIUM CHLORIDE 0.9 % IV SOLN
INTRAVENOUS | Status: DC
  Administered 2017-10-16: 13:00:00 via INTRAVENOUS

## 2017-10-16 MED ORDER — PROPOFOL 500 MG/50ML IV EMUL
INTRAVENOUS | Status: DC | PRN
  Administered 2017-10-16: 180 ug/kg/min via INTRAVENOUS

## 2017-10-16 MED ORDER — SODIUM CHLORIDE 0.9 % IV SOLN: INTRAVENOUS | Status: DC

## 2017-10-16 NOTE — Anesthesia Preprocedure Evaluation (Signed)
Anesthesia Evaluation  Patient identified by MRN, date of birth, ID band Patient awake    Reviewed: Allergy & Precautions, NPO status , Patient's Chart, lab work & pertinent test results  History of Anesthesia Complications Negative for: history of anesthetic complications  Airway Mallampati: II  TM Distance: >3 FB Neck ROM: Full    Dental  (+) Partial Upper   Pulmonary neg sleep apnea, neg COPD, Current Smoker,    breath sounds clear to auscultation- rhonchi (-) wheezing      Cardiovascular hypertension, Pt. on medications (-) CAD and (-) Past MI  Rhythm:Regular Rate:Normal - Systolic murmurs and - Diastolic murmurs    Neuro/Psych PSYCHIATRIC DISORDERS Depression negative neurological ROS     GI/Hepatic Neg liver ROS, GERD  ,  Endo/Other  negative endocrine ROSneg diabetes  Renal/GU negative Renal ROS     Musculoskeletal negative musculoskeletal ROS (+)   Abdominal (+) + obese,   Peds  Hematology negative hematology ROS (+)   Anesthesia Other Findings Past Medical History: No date: GERD (gastroesophageal reflux disease) No date: Hypertension   Reproductive/Obstetrics                             Anesthesia Physical  Anesthesia Plan  ASA: III  Anesthesia Plan: General   Post-op Pain Management:    Induction: Intravenous  PONV Risk Score and Plan: 1 and Propofol infusion  Airway Management Planned: Nasal Cannula  Additional Equipment:   Intra-op Plan:   Post-operative Plan:   Informed Consent: I have reviewed the patients History and Physical, chart, labs and discussed the procedure including the risks, benefits and alternatives for the proposed anesthesia with the patient or authorized representative who has indicated his/her understanding and acceptance.   Dental advisory given  Plan Discussed with: CRNA and Anesthesiologist  Anesthesia Plan Comments:          Anesthesia Quick Evaluation

## 2017-10-16 NOTE — Op Note (Signed)
[facility] Patient Name: Ryan Price Procedure Date: 10/16/2017 1:20 PM MRN: [id number] Account #: [id number] Date of Birth: Oct 22, 1960 Admit Type: Outpatient Age: 57 Room: [facility] ENDO ROOM 3 Gender: Male Note Status: Finalized Procedure:            Colonoscopy Indications:          Family history of colonic polyps in a first-degree                        relative, Personal history of colonic polyps Providers:            Lollie Sails, MD Referring MD:         Leonie Douglas. Doy Hutching, MD (Referring MD) Medicines:            Monitored Anesthesia Care Complications:        No immediate complications. Procedure:            Pre-Anesthesia Assessment:                       - ASA Grade Assessment: III - A patient with severe                        systemic disease.                       After obtaining informed consent, the colonoscope was                        passed under direct vision. Throughout the procedure,                        the patient's blood pressure, pulse, and oxygen                        saturations were monitored continuously. The                        Colonoscope was introduced through the anus and                        advanced to the the cecum, identified by appendiceal                        orifice and ileocecal valve. The colonoscopy was                        performed with moderate difficulty due to poor bowel                        prep. Successful completion of the procedure was aided                        by lavage. The quality of the bowel preparation was                        fair. Findings:      Two sessile polyps were found in the transverse colon. The polyps were 3       mm in size. These polyps were removed with a cold biopsy forceps.       Resection and retrieval were  complete.      Multiple small-mouthed diverticula were found in the sigmoid colon and       distal descending colon.      The digital rectal  exam was normal.      The exam was otherwise without abnormality.      The digital rectal exam was normal. Impression:           - Preparation of the colon was fair.                       - Two 3 mm polyps in the transverse colon, removed with                        a cold biopsy forceps. Resected and retrieved.                       - Diverticulosis in the sigmoid colon and in the distal                        descending colon.                       - The examination was otherwise normal. Recommendation:       - Discharge patient to home.                       - Telephone GI clinic for pathology results in 1 week. Procedure Code(s):    --- Professional ---                       [phone], Colonoscopy, flexible; with biopsy, single or                        multiple Diagnosis Code(s):    --- Professional ---                       D12.3, Benign neoplasm of transverse colon (hepatic                        flexure or splenic flexure)                       Z83.71, Family history of colonic polyps                       Z86.010, Personal history of colonic polyps                       K57.30, Diverticulosis of large intestine without                        perforation or abscess without bleeding CPT copyright 2017 American Medical Association. All rights reserved. The codes documented in this report are preliminary and upon coder review may  be revised to meet current compliance requirements. Lollie Sails, MD 10/16/2017 2:10:42 PM This report has been signed electronically. Number of Addenda: 0 Note Initiated On: 10/16/2017 1:20 PM Scope Withdrawal Time: 0 hours 11 minutes 27 seconds  Total Procedure Duration: 0 hours 24 minutes 43 seconds       [facility]

## 2017-10-16 NOTE — Transfer of Care (Signed)
Immediate Anesthesia Transfer of Care Note  Patient: Ryan Price.  Procedure(s) Performed: COLONOSCOPY WITH PROPOFOL (N/A )  Patient Location: PACU  Anesthesia Type:General  Level of Consciousness: awake and sedated  Airway & Oxygen Therapy: Patient Spontanous Breathing and Patient connected to nasal cannula oxygen  Post-op Assessment: Report given to RN and Post -op Vital signs reviewed and stable  Post vital signs: Reviewed and stable  Last Vitals:  Vitals Value Taken Time  BP    Temp    Pulse    Resp    SpO2      Last Pain:  Vitals:   10/16/17 1311  TempSrc: Tympanic  PainSc: 2          Complications: No apparent anesthesia complications

## 2017-10-16 NOTE — Anesthesia Procedure Notes (Addendum)
Performed by: Cook-Martin, Archita Lomeli Pre-anesthesia Checklist: Patient identified, Emergency Drugs available, Suction available, Patient being monitored and Timeout performed Patient Re-evaluated:Patient Re-evaluated prior to induction Oxygen Delivery Method: Nasal cannula Preoxygenation: Pre-oxygenation with 100% oxygen Induction Type: IV induction Ventilation: Oral airway inserted - appropriate to patient size Placement Confirmation: positive ETCO2 and CO2 detector       

## 2017-10-16 NOTE — Anesthesia Post-op Follow-up Note (Signed)
Anesthesia QCDR form completed.        

## 2017-10-16 NOTE — H&P (Signed)
Outpatient short stay form Pre-procedure 10/16/2017 1:14 PM Lollie Sails MD  Primary Physician: Fulton Reek, MD  Reason for visit: Colonoscopy  History of present illness: Patient is a 57 year old male presenting today as above.  He has personal history of adenomatous colon polyps as well as family history of colon polyps and multiple primary relatives.  He tolerated his prep well.  He takes no aspirin or blood thinning agent.  He has no problems with abdominal pain rectal bleeding or diarrhea.    Current Facility-Administered Medications:  .  0.9 %  sodium chloride infusion, , Intravenous, Continuous, Lollie Sails, MD .  0.9 %  sodium chloride infusion, , Intravenous, Continuous, Lollie Sails, MD, Last Rate: 20 mL/hr at 10/16/17 1310  Medications Prior to Admission  Medication Sig Dispense Refill Last Dose  . buPROPion (WELLBUTRIN SR) 150 MG 12 hr tablet Take by mouth.   10/16/2017 at Unknown time  . BYSTOLIC 10 MG tablet    05/20/1759 at Unknown time  . hydrochlorothiazide (HYDRODIURIL) 25 MG tablet Take 25 mg by mouth daily.     Marland Kitchen lisinopril (PRINIVIL,ZESTRIL) 20 MG tablet Take 20 mg by mouth daily.   10/16/2017 at Unknown time  . meloxicam (MOBIC) 15 MG tablet Take by mouth.   Past Week at Unknown time  . NEXIUM 40 MG capsule    Past Week at Unknown time  . oxyCODONE-acetaminophen (PERCOCET/ROXICET) 5-325 MG tablet    10/15/2017 at Unknown time  . traMADol (ULTRAM) 50 MG tablet Take by mouth.   Past Week at Unknown time  . docusate sodium (COLACE) 100 MG capsule Take by mouth.        No Known Allergies   Past Medical History:  Diagnosis Date  . Arthritis   . Depression   . GERD (gastroesophageal reflux disease)   . Hypertension     Review of systems:      Physical Exam    Heart and lungs: Regular rate and rhythm without rub or gallop, lungs are bilaterally clear.    HEENT: Normocephalic atraumatic eyes are anicteric    Other:    Pertinant exam for  procedure: Soft nontender nondistended bowel sounds positive normoactive.    Planned proceedures: Colonoscopy and indicated procedures. I have discussed the risks benefits and complications of procedures to include not limited to bleeding, infection, perforation and the risk of sedation and the patient wishes to proceed.    Lollie Sails, MD Gastroenterology 10/16/2017  1:14 PM

## 2017-10-17 NOTE — Anesthesia Postprocedure Evaluation (Signed)
Anesthesia Post Note  Patient: Ryan Price.  Procedure(s) Performed: COLONOSCOPY WITH PROPOFOL (N/A )  Patient location during evaluation: Endoscopy Anesthesia Type: General Level of consciousness: awake and alert Pain management: pain level controlled Vital Signs Assessment: post-procedure vital signs reviewed and stable Respiratory status: spontaneous breathing, nonlabored ventilation, respiratory function stable and patient connected to nasal cannula oxygen Cardiovascular status: blood pressure returned to baseline and stable Postop Assessment: no apparent nausea or vomiting Anesthetic complications: no     Last Vitals:  Vitals:   10/16/17 1415 10/16/17 1425  BP: 107/63 (!) 142/90  Pulse: 75 80  Resp: 20 13  Temp:    SpO2: 100% 100%    Last Pain:  Vitals:   10/16/17 1425  TempSrc:   PainSc: 0-No pain                 Martha Clan

## 2017-10-18 ENCOUNTER — Encounter: Payer: Self-pay | Admitting: Gastroenterology

## 2017-10-18 LAB — SURGICAL PATHOLOGY

## 2018-07-03 ENCOUNTER — Other Ambulatory Visit: Payer: Self-pay | Admitting: Internal Medicine

## 2018-07-03 DIAGNOSIS — M7989 Other specified soft tissue disorders: Secondary | ICD-10-CM

## 2018-07-10 ENCOUNTER — Ambulatory Visit: Payer: Medicare Other

## 2018-07-17 ENCOUNTER — Ambulatory Visit
Admission: RE | Admit: 2018-07-17 | Discharge: 2018-07-17 | Disposition: A | Discharge status: Home / Self Care | Payer: Medicare Other | Source: Ambulatory Visit | Attending: Internal Medicine | Admitting: Internal Medicine

## 2018-07-17 DIAGNOSIS — M7989 Other specified soft tissue disorders: Secondary | ICD-10-CM | POA: Insufficient documentation

## 2018-10-14 ENCOUNTER — Other Ambulatory Visit: Payer: Self-pay

## 2018-10-14 ENCOUNTER — Ambulatory Visit: Payer: Medicare Other | Attending: Anesthesiology | Admitting: Anesthesiology

## 2018-10-14 ENCOUNTER — Encounter: Payer: Self-pay | Admitting: Anesthesiology

## 2018-10-14 DIAGNOSIS — G894 Chronic pain syndrome: Secondary | ICD-10-CM

## 2018-10-14 DIAGNOSIS — G8929 Other chronic pain: Secondary | ICD-10-CM

## 2018-10-14 DIAGNOSIS — M5136 Other intervertebral disc degeneration, lumbar region: Secondary | ICD-10-CM

## 2018-10-14 DIAGNOSIS — F1190 Opioid use, unspecified, uncomplicated: Secondary | ICD-10-CM | POA: Diagnosis not present

## 2018-10-14 DIAGNOSIS — M542 Cervicalgia: Secondary | ICD-10-CM

## 2018-10-14 DIAGNOSIS — M5432 Sciatica, left side: Secondary | ICD-10-CM

## 2018-10-14 DIAGNOSIS — M5431 Sciatica, right side: Secondary | ICD-10-CM | POA: Diagnosis not present

## 2018-10-14 DIAGNOSIS — M5441 Lumbago with sciatica, right side: Secondary | ICD-10-CM

## 2018-10-14 DIAGNOSIS — M5442 Lumbago with sciatica, left side: Secondary | ICD-10-CM
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
## 2018-10-15 ENCOUNTER — Other Ambulatory Visit: Payer: Self-pay

## 2018-10-15 ENCOUNTER — Other Ambulatory Visit
Admission: RE | Admit: 2018-10-15 | Discharge: 2018-10-15 | Disposition: A | Discharge status: Home / Self Care | Payer: Medicare Other | Attending: Anesthesiology | Admitting: Anesthesiology

## 2018-10-15 DIAGNOSIS — Z0000 Encounter for general adult medical examination without abnormal findings: Secondary | ICD-10-CM | POA: Insufficient documentation

## 2018-10-15 LAB — URINE DRUG SCREEN, QUALITATIVE (ARMC ONLY)
Amphetamines, Ur Screen: NOT DETECTED
Barbiturates, Ur Screen: NOT DETECTED
Benzodiazepine, Ur Scrn: NOT DETECTED
Cannabinoid 50 Ng, Ur ~~LOC~~: NOT DETECTED
Cocaine Metabolite,Ur ~~LOC~~: NOT DETECTED
MDMA (Ecstasy)Ur Screen: NOT DETECTED
Methadone Scn, Ur: NOT DETECTED
Opiate, Ur Screen: NOT DETECTED
Phencyclidine (PCP) Ur S: NOT DETECTED
Tricyclic, Ur Screen: NOT DETECTED

## 2018-10-22 ENCOUNTER — Telehealth: Payer: Self-pay | Admitting: *Deleted

## 2018-10-22 NOTE — Telephone Encounter (Signed)
Please make patient an appointment with Dr Andree Elk for a med refill asap.  Thank you

## 2018-10-25 ENCOUNTER — Ambulatory Visit: Payer: Medicare Other | Attending: Anesthesiology | Admitting: Anesthesiology

## 2018-10-25 ENCOUNTER — Encounter: Payer: Self-pay | Admitting: Anesthesiology

## 2018-10-25 ENCOUNTER — Other Ambulatory Visit: Payer: Self-pay

## 2018-10-25 DIAGNOSIS — M545 Low back pain: Secondary | ICD-10-CM | POA: Diagnosis not present

## 2018-10-25 DIAGNOSIS — M5432 Sciatica, left side: Secondary | ICD-10-CM

## 2018-10-25 DIAGNOSIS — M5136 Other intervertebral disc degeneration, lumbar region: Secondary | ICD-10-CM

## 2018-10-25 DIAGNOSIS — F1190 Opioid use, unspecified, uncomplicated: Secondary | ICD-10-CM

## 2018-10-25 DIAGNOSIS — G894 Chronic pain syndrome: Secondary | ICD-10-CM

## 2018-10-25 DIAGNOSIS — M5431 Sciatica, right side: Secondary | ICD-10-CM

## 2018-10-25 DIAGNOSIS — M5412 Radiculopathy, cervical region: Secondary | ICD-10-CM | POA: Diagnosis not present

## 2018-10-25 DIAGNOSIS — G8929 Other chronic pain: Secondary | ICD-10-CM

## 2018-10-25 DIAGNOSIS — M51369 Other intervertebral disc degeneration, lumbar region without mention of lumbar back pain or lower extremity pain: Secondary | ICD-10-CM | POA: Insufficient documentation

## 2018-10-28 ENCOUNTER — Telehealth: Payer: Self-pay | Admitting: *Deleted

## 2018-10-28 NOTE — Telephone Encounter (Signed)
Reschedule appointment asap please

## 2018-10-30 ENCOUNTER — Telehealth: Payer: Self-pay | Admitting: *Deleted

## 2018-10-30 ENCOUNTER — Encounter: Payer: Self-pay | Admitting: Anesthesiology

## 2018-10-30 DIAGNOSIS — M5432 Sciatica, left side: Secondary | ICD-10-CM | POA: Insufficient documentation

## 2018-10-30 DIAGNOSIS — M5430 Sciatica, unspecified side: Secondary | ICD-10-CM | POA: Insufficient documentation

## 2018-10-30 MED ORDER — HYDROCODONE-ACETAMINOPHEN 10-325 MG PO TABS: 1.0000 | ORAL_TABLET | Freq: Four times a day (QID) | ORAL | 0 refills | Status: DC | PRN

## 2018-10-30 NOTE — Progress Notes (Signed)
Virtual Visit via Telephone Note  I connected with Ryan Price. on 10/30/18 at  3:15 PM EDT by telephone and verified that I am speaking with the correct person using two identifiers.  Location: Patient: Home Provider: Pain control center   I discussed the limitations, risks, security and privacy concerns of performing an evaluation and management service by telephone and the availability of in person appointments. I also discussed with the patient that there may be a patient responsible charge related to this service. The patient expressed understanding and agreed to proceed.   History of Present Illness: I spoke with [name] regarding his persistent low back pain and bilateral calf cramping and lower leg pain and hip pain.  He is also still having neck pain.  His urine drug screen did come back and was negative.  He is requesting his opioid medications which she has been on chronically secondary to failed conservative therapy.  He denies any diverting or illicit use and has had good effect with these medications in the past.  Otherwise no change in his lower extremity strength or function is noted and he is requesting an epidural as soon as the clinic can reopen.    Observations/Objective:  Current Outpatient Medications:  .  buPROPion (WELLBUTRIN SR) 150 MG 12 hr tablet, Take by mouth., Disp: , Rfl:  .  BYSTOLIC 10 MG tablet, , Disp: , Rfl:  .  docusate sodium (COLACE) 100 MG capsule, Take by mouth., Disp: , Rfl:  .  hydrochlorothiazide (HYDRODIURIL) 25 MG tablet, Take 25 mg by mouth daily., Disp: , Rfl:  .  HYDROcodone-acetaminophen (NORCO) 10-325 MG tablet, Take 1 tablet by mouth every 6 (six) hours as needed for up to 30 days for moderate pain or severe pain., Disp: 120 tablet, Rfl: 0 .  lisinopril (PRINIVIL,ZESTRIL) 20 MG tablet, Take 20 mg by mouth daily., Disp: , Rfl:  .  meloxicam (MOBIC) 15 MG tablet, Take by mouth., Disp: , Rfl:  .  NEXIUM 40 MG capsule, , Disp: , Rfl:   .  oxyCODONE-acetaminophen (PERCOCET/ROXICET) 5-325 MG tablet, , Disp: , Rfl:  .  traMADol (ULTRAM) 50 MG tablet, Take by mouth., Disp: , Rfl:   Assessment and Plan: 1. Degenerative disc disease, lumbar   2. Chronic pain syndrome   3. Cervical radiculitis   4. Chronic bilateral low back pain without sciatica   5. DDD (degenerative disc disease), lumbar   6. Chronic, continuous use of opioids   7. Bilateral sciatica   Based on my review today of the [facility] practitioner database information and with his negative urine drug screen I am going to restart his hydrocodone 10 mg tablets 4 times a day.  This is worked well for him in the past.  I want him to discontinue his tramadol.  We will have him continue with stretching strengthening exercises and have him return to the pain control center for an epidural at the next available date for his lumbar degenerative disc disease and sciatica.  Follow Up Instructions:    I discussed the assessment and treatment plan with the patient. The patient was provided an opportunity to ask questions and all were answered. The patient agreed with the plan and demonstrated an understanding of the instructions.   The patient was advised to call back or seek an in-person evaluation if the symptoms worsen or if the condition fails to improve as anticipated.  I provided 20 minutes of non-face-to-face time during this encounter.  Molli Barrows, MD

## 2018-10-30 NOTE — Telephone Encounter (Signed)
Spoke with Dr Andree Elk and he states he will call patient today.

## 2018-11-22 ENCOUNTER — Other Ambulatory Visit: Admission: RE | Admit: 2018-11-22 | Payer: Medicare Other | Source: Ambulatory Visit

## 2018-11-25 ENCOUNTER — Other Ambulatory Visit
Admission: RE | Admit: 2018-11-25 | Discharge: 2018-11-25 | Disposition: A | Discharge status: Home / Self Care | Payer: Medicare Other | Source: Ambulatory Visit | Attending: Anesthesiology | Admitting: Anesthesiology

## 2018-11-25 ENCOUNTER — Other Ambulatory Visit: Payer: Self-pay

## 2018-11-25 DIAGNOSIS — Z1159 Encounter for screening for other viral diseases: Secondary | ICD-10-CM | POA: Diagnosis present

## 2018-11-26 LAB — SARS CORONAVIRUS 2 (TAT 6-24 HRS): SARS Coronavirus 2: NEGATIVE

## 2018-11-27 ENCOUNTER — Ambulatory Visit
Admission: RE | Admit: 2018-11-27 | Discharge: 2018-11-27 | Disposition: A | Discharge status: Home / Self Care | Payer: Medicare Other | Source: Ambulatory Visit | Attending: Anesthesiology | Admitting: Anesthesiology

## 2018-11-27 ENCOUNTER — Other Ambulatory Visit: Payer: Self-pay | Admitting: Anesthesiology

## 2018-11-27 ENCOUNTER — Encounter: Payer: Self-pay | Admitting: Anesthesiology

## 2018-11-27 ENCOUNTER — Other Ambulatory Visit: Payer: Self-pay

## 2018-11-27 ENCOUNTER — Ambulatory Visit: Payer: Medicare Other | Attending: Anesthesiology | Admitting: Anesthesiology

## 2018-11-27 VITALS — BP 120/73 | HR 93 | Resp 16 | Ht 75.0 in | Wt 262.0 lb

## 2018-11-27 DIAGNOSIS — F1190 Opioid use, unspecified, uncomplicated: Secondary | ICD-10-CM | POA: Insufficient documentation

## 2018-11-27 DIAGNOSIS — M5136 Other intervertebral disc degeneration, lumbar region: Secondary | ICD-10-CM | POA: Insufficient documentation

## 2018-11-27 DIAGNOSIS — M545 Low back pain, unspecified: Secondary | ICD-10-CM

## 2018-11-27 DIAGNOSIS — M5432 Sciatica, left side: Secondary | ICD-10-CM | POA: Insufficient documentation

## 2018-11-27 DIAGNOSIS — G894 Chronic pain syndrome: Secondary | ICD-10-CM | POA: Insufficient documentation

## 2018-11-27 DIAGNOSIS — M5431 Sciatica, right side: Secondary | ICD-10-CM | POA: Insufficient documentation

## 2018-11-27 DIAGNOSIS — G8929 Other chronic pain: Secondary | ICD-10-CM | POA: Insufficient documentation

## 2018-11-27 DIAGNOSIS — M5412 Radiculopathy, cervical region: Secondary | ICD-10-CM | POA: Diagnosis present

## 2018-11-27 DIAGNOSIS — R52 Pain, unspecified: Secondary | ICD-10-CM

## 2018-11-27 MED ORDER — HYDROCODONE-ACETAMINOPHEN 10-325 MG PO TABS: 1.0000 | ORAL_TABLET | Freq: Four times a day (QID) | ORAL | 0 refills | Status: DC | PRN

## 2018-11-27 MED ORDER — LIDOCAINE HCL (PF) 1 % IJ SOLN
INTRAMUSCULAR | Status: AC
  Filled 2018-11-27: qty 5

## 2018-11-27 MED ORDER — LIDOCAINE HCL (PF) 1 % IJ SOLN
5.0000 mL | Freq: Once | INTRAMUSCULAR | Status: AC
  Administered 2018-11-27: 5 mL via SUBCUTANEOUS

## 2018-11-27 MED ORDER — ROPIVACAINE HCL 2 MG/ML IJ SOLN
10.0000 mL | Freq: Once | INTRAMUSCULAR | Status: AC
  Administered 2018-11-27: 15:00:00 10 mL via EPIDURAL

## 2018-11-27 MED ORDER — IOPAMIDOL (ISOVUE-M 200) INJECTION 41%
20.0000 mL | Freq: Once | INTRAMUSCULAR | Status: DC | PRN
  Administered 2018-11-27: 20 mL
  Filled 2018-11-27: qty 20

## 2018-11-27 MED ORDER — ROPIVACAINE HCL 2 MG/ML IJ SOLN
INTRAMUSCULAR | Status: AC
  Filled 2018-11-27: qty 10

## 2018-11-27 MED ORDER — SODIUM CHLORIDE (PF) 0.9 % IJ SOLN
INTRAMUSCULAR | Status: AC
  Filled 2018-11-27: qty 10

## 2018-11-27 MED ORDER — HYDROCODONE-ACETAMINOPHEN 10-325 MG PO TABS: 1.0000 | ORAL_TABLET | Freq: Four times a day (QID) | ORAL | 0 refills | Status: AC | PRN

## 2018-11-27 MED ORDER — SODIUM CHLORIDE 0.9% FLUSH
10.0000 mL | Freq: Once | INTRAVENOUS | Status: AC
  Administered 2018-11-27: 10 mL

## 2018-11-27 MED ORDER — TRIAMCINOLONE ACETONIDE 40 MG/ML IJ SUSP
INTRAMUSCULAR | Status: AC
  Filled 2018-11-27: qty 1

## 2018-11-27 MED ORDER — TRIAMCINOLONE ACETONIDE 40 MG/ML IJ SUSP
40.0000 mg | Freq: Once | INTRAMUSCULAR | Status: AC
  Administered 2018-11-27: 40 mg
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
## 2018-11-27 NOTE — Progress Notes (Signed)
Safety precautions to be maintained throughout the outpatient stay will include: orient to surroundings, keep bed in low position, maintain call bell within reach at all times, provide assistance with transfer out of bed and ambulation.  

## 2018-11-27 NOTE — Patient Instructions (Signed)

## 2018-11-28 ENCOUNTER — Telehealth: Payer: Self-pay | Admitting: *Deleted

## 2018-11-28 NOTE — Telephone Encounter (Signed)
Denies post procedure issues.

## 2018-12-16 ENCOUNTER — Ambulatory Visit: Payer: Medicare Other | Admitting: Anesthesiology

## 2018-12-25 ENCOUNTER — Encounter: Payer: Self-pay | Admitting: Anesthesiology

## 2018-12-25 ENCOUNTER — Other Ambulatory Visit: Payer: Self-pay

## 2018-12-25 ENCOUNTER — Ambulatory Visit: Payer: Medicare Other | Attending: Anesthesiology | Admitting: Anesthesiology

## 2018-12-25 VITALS — BP 119/88 | HR 77 | Temp 99.4°F | Resp 18 | Ht 75.0 in | Wt 263.0 lb

## 2018-12-25 DIAGNOSIS — M5432 Sciatica, left side: Secondary | ICD-10-CM

## 2018-12-25 DIAGNOSIS — M5431 Sciatica, right side: Secondary | ICD-10-CM | POA: Diagnosis not present

## 2018-12-25 DIAGNOSIS — M5412 Radiculopathy, cervical region: Secondary | ICD-10-CM | POA: Diagnosis not present

## 2018-12-25 DIAGNOSIS — M542 Cervicalgia: Secondary | ICD-10-CM

## 2018-12-25 DIAGNOSIS — M51369 Other intervertebral disc degeneration, lumbar region without mention of lumbar back pain or lower extremity pain: Secondary | ICD-10-CM

## 2018-12-25 DIAGNOSIS — M5136 Other intervertebral disc degeneration, lumbar region: Secondary | ICD-10-CM

## 2018-12-25 DIAGNOSIS — G894 Chronic pain syndrome: Secondary | ICD-10-CM | POA: Diagnosis not present

## 2018-12-25 DIAGNOSIS — F1190 Opioid use, unspecified, uncomplicated: Secondary | ICD-10-CM

## 2018-12-25 MED ORDER — HYDROCODONE-ACETAMINOPHEN 10-325 MG PO TABS: 1.0000 | ORAL_TABLET | Freq: Four times a day (QID) | ORAL | 0 refills | Status: DC | PRN

## 2018-12-25 NOTE — Progress Notes (Signed)
Nursing Pain Medication Assessment:  Safety precautions to be maintained throughout the outpatient stay will include: orient to surroundings, keep bed in low position, maintain call bell within reach at all times, provide assistance with transfer out of bed and ambulation.  Medication Inspection Compliance: Mr. Stejskal did not comply with our request to bring his pills to be counted. He was reminded that bringing the medication bottles, even when empty, is a requirement.  Medication: None brought in. Pill/Patch Count: None available to be counted. Bottle Appearance: No container available. Did not bring bottle(s) to appointment. Filled Date: N/A Last Medication intake:  Day before yesterday

## 2018-12-27 NOTE — Progress Notes (Signed)
Subjective:  Patient ID: Ryan Price., male    DOB: [birth year]  Age: 58 y.o. MRN: [id number]  CC: Neck Pain (left)   Procedure: None  HPI Ryan Price. presents for reevaluation today.  He states his been doing well in regards to his low back pain and responded favorably to his last epidural.  He reports a 75% improvement in his calf and leg pain and his low back pain is been better as well.  He is working on his stretching strengthening exercises as tolerated and continues to take the Vicodin as prescribed and this works well for his pain relief.  Otherwise he is in his usual state of health.  No new changes in lower extremity strength or function are reported and his bowel bladder function is been stable as well.  No side effects with his medications are reported either.  Outpatient Medications Prior to Visit  Medication Sig Dispense Refill  . buPROPion (WELLBUTRIN SR) 150 MG 12 hr tablet Take by mouth.    . BYSTOLIC 10 MG tablet     . fluticasone (FLONASE) 50 MCG/ACT nasal spray     . hydrochlorothiazide (HYDRODIURIL) 25 MG tablet Take 25 mg by mouth daily.    Derrill Memo ON 12/29/2018] HYDROcodone-acetaminophen (NORCO) 10-325 MG tablet Take 1 tablet by mouth every 6 (six) hours as needed for moderate pain or severe pain. 120 tablet 0  . lisinopril (PRINIVIL,ZESTRIL) 20 MG tablet Take 20 mg by mouth daily.    Marland Kitchen lovastatin (MEVACOR) 20 MG tablet     . NEXIUM 40 MG capsule     . HYDROcodone-acetaminophen (NORCO) 10-325 MG tablet Take 1 tablet by mouth every 6 (six) hours as needed for moderate pain or severe pain. 120 tablet 0  . docusate sodium (COLACE) 100 MG capsule Take by mouth.    . meloxicam (MOBIC) 15 MG tablet Take by mouth.    . oxyCODONE-acetaminophen (PERCOCET/ROXICET) 5-325 MG tablet     . traMADol (ULTRAM) 50 MG tablet Take by mouth.     No facility-administered medications prior to visit.     Review of Systems CNS: No confusion or sedation Cardiac: No  angina or palpitations GI: No abdominal pain or constipation Constitutional: No nausea vomiting fevers or chills  Objective:  BP 119/88   Pulse 77   Temp 99.4 F (37.4 C)   Resp 18   Ht 6\' 3"  (1.905 m)   Wt 263 lb (119.3 kg)   SpO2 99%   BMI 32.87 kg/m    BP Readings from Last 3 Encounters:  12/25/18 119/88  11/27/18 120/73  10/16/17 (!) 142/90     Wt Readings from Last 3 Encounters:  12/25/18 263 lb (119.3 kg)  11/27/18 262 lb (118.8 kg)  10/16/17 247 lb (112 kg)     Physical Exam Pt is alert and oriented PERRL EOMI HEART IS RRR no murmur or rub LCTA no wheezing or rales MUSCULOSKELETAL reveals some paraspinous muscle tenderness and a mildly antalgic gait but he appears to be ambulating well and his muscle tone and bulk to the lower extremities is at baseline.  Labs  No results found for: HGBA1C Lab Results  Component Value Date   CREATININE 0.81 05/26/2015    -------------------------------------------------------------------------------------------------------------------- Lab Results  Component Value Date   WBC 13.6 (H) 05/26/2015   HGB 13.9 04/26/2016   HCT 48.0 05/26/2015   PLT 242 05/26/2015   GLUCOSE 117 (H) 05/26/2015   ALT 28 05/26/2015   AST  21 05/26/2015   NA 138 05/26/2015   K 4.1 05/26/2015   CL 112 (H) 05/26/2015   CREATININE 0.81 05/26/2015   BUN 13 05/26/2015   CO2 21 (L) 05/26/2015    --------------------------------------------------------------------------------------------------------------------- [facility] C-arm 1-60 Min No Report  Result Date: 11/27/2018 Fluoro was used, but no Radiologist interpretation will be provided. Please refer to "NOTES" tab for provider progress note.    Assessment & Plan:   Ryan Price was seen today for neck pain.  Diagnoses and all orders for this visit:  Degenerative disc disease, lumbar  Bilateral sciatica  Chronic pain syndrome  Cervical radiculitis  DDD (degenerative disc  disease), lumbar  Chronic, continuous use of opioids  Cervicalgia  Other orders -     HYDROcodone-acetaminophen (NORCO) 10-325 MG tablet; Take 1 tablet by mouth every 6 (six) hours as needed for moderate pain or severe pain.        ----------------------------------------------------------------------------------------------------------------------  Problem List Items Addressed This Visit      Unprioritized   Cervical radiculitis   Chronic pain   Relevant Medications   HYDROcodone-acetaminophen (NORCO) 10-325 MG tablet (Start on 12/29/2018)   Chronic, continuous use of opioids   DDD (degenerative disc disease), lumbar   Relevant Medications   HYDROcodone-acetaminophen (NORCO) 10-325 MG tablet (Start on 12/29/2018)   Degenerative disc disease, lumbar - Primary   Relevant Medications   HYDROcodone-acetaminophen (NORCO) 10-325 MG tablet (Start on 12/29/2018)   Sciatica    Other Visit Diagnoses    Cervicalgia            ----------------------------------------------------------------------------------------------------------------------  1. Degenerative disc disease, lumbar Continue with core stretching strengthening exercises as prescribed.  We will have him return to clinic in 1 month for reevaluation.  2. Bilateral sciatica As above and we will defer on any further epidural steroid injections at this time with holding for further exacerbation if indicated.  3. Chronic pain syndrome He appears to be doing well with the opioid management protocol at present.  I have reviewed the [facility] practitioner database information.  I will refill his medications dated for August 16 and September 15.  He is instructed to contact us at the pain control center should he have any further problems with pain management for his medications.  4. Cervical radiculitis Continue with stretching strengthening exercises.  We did talk about some range of motion exercises for his neck pain.   This appears to be stable at present  5. DDD (degenerative disc disease), lumbar As above  6. Chronic, continuous use of opioids As above  7. Cervicalgia As above    ----------------------------------------------------------------------------------------------------------------------  I am having Ryan Price. maintain his lisinopril, buPROPion, docusate sodium, meloxicam, traMADol, Bystolic, NexIUM, oxyCODONE-acetaminophen, hydrochlorothiazide, fluticasone, lovastatin, HYDROcodone-acetaminophen, and HYDROcodone-acetaminophen.   Meds ordered this encounter  Medications  . HYDROcodone-acetaminophen (NORCO) 10-325 MG tablet    Sig: Take 1 tablet by mouth every 6 (six) hours as needed for moderate pain or severe pain.    Dispense:  120 tablet    Refill:  0   Patient's Medications  New Prescriptions   No medications on file  Previous Medications   BUPROPION (WELLBUTRIN SR) 150 MG 12 HR TABLET    Take by mouth.   BYSTOLIC 10 MG TABLET       DOCUSATE SODIUM (COLACE) 100 MG CAPSULE    Take by mouth.   FLUTICASONE (FLONASE) 50 MCG/ACT NASAL SPRAY       HYDROCHLOROTHIAZIDE (HYDRODIURIL) 25 MG TABLET    Take  25 mg by mouth daily.   HYDROCODONE-ACETAMINOPHEN (NORCO) 10-325 MG TABLET    Take 1 tablet by mouth every 6 (six) hours as needed for moderate pain or severe pain.   LISINOPRIL (PRINIVIL,ZESTRIL) 20 MG TABLET    Take 20 mg by mouth daily.   LOVASTATIN (MEVACOR) 20 MG TABLET       MELOXICAM (MOBIC) 15 MG TABLET    Take by mouth.   NEXIUM 40 MG CAPSULE       OXYCODONE-ACETAMINOPHEN (PERCOCET/ROXICET) 5-325 MG TABLET       TRAMADOL (ULTRAM) 50 MG TABLET    Take by mouth.  Modified Medications   Modified Medication Previous Medication   HYDROCODONE-ACETAMINOPHEN (NORCO) 10-325 MG TABLET HYDROcodone-acetaminophen (NORCO) 10-325 MG tablet      Take 1 tablet by mouth every 6 (six) hours as needed for moderate pain or severe pain.    Take 1 tablet by mouth every 6 (six) hours  as needed for moderate pain or severe pain.  Discontinued Medications   No medications on file   ----------------------------------------------------------------------------------------------------------------------  Follow-up: Return in about 1 month (around 01/25/2019) for evaluation, med refill.    Molli Barrows, MD

## 2019-01-21 ENCOUNTER — Ambulatory Visit: Payer: Medicare Other | Attending: Anesthesiology | Admitting: Anesthesiology

## 2019-01-21 ENCOUNTER — Other Ambulatory Visit: Payer: Self-pay

## 2019-01-21 ENCOUNTER — Encounter: Payer: Self-pay | Admitting: Anesthesiology

## 2019-01-21 DIAGNOSIS — M5136 Other intervertebral disc degeneration, lumbar region: Secondary | ICD-10-CM | POA: Diagnosis not present

## 2019-01-21 DIAGNOSIS — M51369 Other intervertebral disc degeneration, lumbar region without mention of lumbar back pain or lower extremity pain: Secondary | ICD-10-CM

## 2019-01-21 DIAGNOSIS — M5412 Radiculopathy, cervical region: Secondary | ICD-10-CM | POA: Diagnosis not present

## 2019-01-21 DIAGNOSIS — M545 Low back pain, unspecified: Secondary | ICD-10-CM

## 2019-01-21 DIAGNOSIS — G8929 Other chronic pain: Secondary | ICD-10-CM

## 2019-01-21 DIAGNOSIS — M5431 Sciatica, right side: Secondary | ICD-10-CM

## 2019-01-21 DIAGNOSIS — G894 Chronic pain syndrome: Secondary | ICD-10-CM

## 2019-01-21 DIAGNOSIS — M5432 Sciatica, left side: Secondary | ICD-10-CM

## 2019-01-21 DIAGNOSIS — M542 Cervicalgia: Secondary | ICD-10-CM

## 2019-01-21 DIAGNOSIS — F1190 Opioid use, unspecified, uncomplicated: Secondary | ICD-10-CM

## 2019-01-21 MED ORDER — HYDROCODONE-ACETAMINOPHEN 10-325 MG PO TABS: 1.0000 | ORAL_TABLET | Freq: Four times a day (QID) | ORAL | 0 refills | Status: DC | PRN

## 2019-01-21 NOTE — Progress Notes (Signed)
Virtual Visit via Telephone Note  I connected with Ryan Price. on 01/21/19 at  1:30 PM EDT by telephone and verified that I am speaking with the correct person using two identifiers.  Location: Patient: Home Provider: Pain control center   I discussed the limitations, risks, security and privacy concerns of performing an evaluation and management service by telephone and the availability of in person appointments. I also discussed with the patient that there may be a patient responsible charge related to this service. The patient expressed understanding and agreed to proceed.   History of Present Illness: I had a long discussion with Ryan Price regarding his low back pain and lower leg pain.  This has been stable for him.  It has done very well following the last epidural and he is essentially been symptom-free unless he has prolonged standing.  He feels that this is done very well following the epidural.  He is primarily complaining of some neck pain at this point which is been a chronic condition for him.  He occasionally gets some hand pain as well but no reports of weakness are noted.  He has had previous neck x-ray showing evidence of degenerative disc disease at 2 levels but no previous MRI is reported.  The pain primarily is worse with activity and can be considerable.  He takes his medications and this does help.  Based on our discussion today he continues to derive good functional lifestyle improvement with the medications and no side effects are reported.  He is doing his stretching strengthening exercises for his cervical musculature and these do give him some mild relief.   Observations/Objective: Current Outpatient Medications:  .  buPROPion (WELLBUTRIN SR) 150 MG 12 hr tablet, Take by mouth., Disp: , Rfl:  .  BYSTOLIC 10 MG tablet, , Disp: , Rfl:  .  docusate sodium (COLACE) 100 MG capsule, Take by mouth., Disp: , Rfl:  .  fluticasone (FLONASE) 50 MCG/ACT nasal spray, ,  Disp: , Rfl:  .  hydrochlorothiazide (HYDRODIURIL) 25 MG tablet, Take 25 mg by mouth daily., Disp: , Rfl:  .  HYDROcodone-acetaminophen (NORCO) 10-325 MG tablet, Take 1 tablet by mouth every 6 (six) hours as needed for moderate pain or severe pain., Disp: 120 tablet, Rfl: 0 .  [START ON 01/29/2019] HYDROcodone-acetaminophen (NORCO) 10-325 MG tablet, Take 1 tablet by mouth every 6 (six) hours as needed for moderate pain or severe pain., Disp: 120 tablet, Rfl: 0 .  lisinopril (PRINIVIL,ZESTRIL) 20 MG tablet, Take 20 mg by mouth daily., Disp: , Rfl:  .  lovastatin (MEVACOR) 20 MG tablet, , Disp: , Rfl:  .  meloxicam (MOBIC) 15 MG tablet, Take by mouth., Disp: , Rfl:  .  NEXIUM 40 MG capsule, , Disp: , Rfl:  .  oxyCODONE-acetaminophen (PERCOCET/ROXICET) 5-325 MG tablet, , Disp: , Rfl:  .  traMADol (ULTRAM) 50 MG tablet, Take by mouth., Disp: , Rfl:    Assessment and Plan: 1. Degenerative disc disease, lumbar   2. Bilateral sciatica   3. Chronic pain syndrome   4. Cervical radiculitis   5. DDD (degenerative disc disease), lumbar   6. Chronic, continuous use of opioids   7. Cervicalgia   8. Chronic bilateral low back pain without sciatica   Based on our discussion today I think it is reasonable to continue his current hydrocodone.  He is taking this responsibly and as directed based on review of the [facility] practitioner database information and our discussion today.  Refills will be dated for September 16.  I want him to continue with the stretching strengthening exercises as reviewed with him today and I will have him scheduled for return follow-up in 1 month.  Depending on how he does over the next 2 to 3 months in regards to his cervical pain and as long as he has no reported weakness we will defer on a cervical MRI.  However this may be considered and ultimately we may consider cervical epidurals as discussed.  In the meantime I want him to continue follow-up with his primary care physicians  for his baseline medical care.   Follow Up Instructions:    I discussed the assessment and treatment plan with the patient. The patient was provided an opportunity to ask questions and all were answered. The patient agreed with the plan and demonstrated an understanding of the instructions.   The patient was advised to call back or seek an in-person evaluation if the symptoms worsen or if the condition fails to improve as anticipated.  I provided 30 minutes of non-face-to-face time during this encounter.   Molli Barrows, MD

## 2019-02-19 ENCOUNTER — Encounter: Payer: Self-pay | Admitting: Anesthesiology

## 2019-02-19 ENCOUNTER — Ambulatory Visit: Payer: Medicare Other | Attending: Anesthesiology | Admitting: Anesthesiology

## 2019-02-19 ENCOUNTER — Other Ambulatory Visit: Payer: Self-pay

## 2019-02-19 DIAGNOSIS — G894 Chronic pain syndrome: Secondary | ICD-10-CM

## 2019-02-19 DIAGNOSIS — F1190 Opioid use, unspecified, uncomplicated: Secondary | ICD-10-CM

## 2019-02-19 DIAGNOSIS — M5432 Sciatica, left side: Secondary | ICD-10-CM

## 2019-02-19 DIAGNOSIS — M542 Cervicalgia: Secondary | ICD-10-CM

## 2019-02-19 DIAGNOSIS — M5412 Radiculopathy, cervical region: Secondary | ICD-10-CM

## 2019-02-19 DIAGNOSIS — M5136 Other intervertebral disc degeneration, lumbar region: Secondary | ICD-10-CM

## 2019-02-19 DIAGNOSIS — M5431 Sciatica, right side: Secondary | ICD-10-CM

## 2019-02-19 DIAGNOSIS — M51369 Other intervertebral disc degeneration, lumbar region without mention of lumbar back pain or lower extremity pain: Secondary | ICD-10-CM

## 2019-02-19 MED ORDER — HYDROCODONE-ACETAMINOPHEN 10-325 MG PO TABS: 1.0000 | ORAL_TABLET | Freq: Four times a day (QID) | ORAL | 0 refills | Status: DC | PRN

## 2019-02-19 NOTE — Progress Notes (Signed)
Virtual Visit via Telephone Note  I connected with Ryan Price. on 02/19/19 at  1:30 PM EDT by telephone and verified that I am speaking with the correct person using two identifiers.  Location: Patient: Home Provider: Pain control center   I discussed the limitations, risks, security and privacy concerns of performing an evaluation and management service by telephone and the availability of in person appointments. I also discussed with the patient that there may be a patient responsible charge related to this service. The patient expressed understanding and agreed to proceed.   History of Present Illness: I spoke with Ryan Price on the phone today regarding his low back pain.  He continues to have pain in the low back and calves but continues to respond to the recent epidural that he had.  He is also having considerable neck pain and trapezius pain that has been problematic.  He has been doing some stretching exercises with limited success.  No problems with upper extremity weakness are reported.  He has been active with lower back exercising and walking with limited success as well.  He is taking his medications as prescribed with no evidence of any diverting or illicit use and getting good relief from these per his report today.  No side effects are reported.  Otherwise he is in his usual state of health.    Observations/Objective: Current Outpatient Medications:  .  buPROPion (WELLBUTRIN SR) 150 MG 12 hr tablet, Take by mouth., Disp: , Rfl:  .  BYSTOLIC 10 MG tablet, , Disp: , Rfl:  .  docusate sodium (COLACE) 100 MG capsule, Take by mouth., Disp: , Rfl:  .  fluticasone (FLONASE) 50 MCG/ACT nasal spray, , Disp: , Rfl:  .  hydrochlorothiazide (HYDRODIURIL) 25 MG tablet, Take 25 mg by mouth daily., Disp: , Rfl:  .  [START ON 02/28/2019] HYDROcodone-acetaminophen (NORCO) 10-325 MG tablet, Take 1 tablet by mouth every 6 (six) hours as needed for moderate pain or severe pain., Disp: 120  tablet, Rfl: 0 .  lisinopril (PRINIVIL,ZESTRIL) 20 MG tablet, Take 20 mg by mouth daily., Disp: , Rfl:  .  lovastatin (MEVACOR) 20 MG tablet, , Disp: , Rfl:  .  meloxicam (MOBIC) 15 MG tablet, Take by mouth., Disp: , Rfl:  .  NEXIUM 40 MG capsule, , Disp: , Rfl:  .  oxyCODONE-acetaminophen (PERCOCET/ROXICET) 5-325 MG tablet, , Disp: , Rfl:  .  traMADol (ULTRAM) 50 MG tablet, Take by mouth., Disp: , Rfl:    Assessment and Plan: 1. Degenerative disc disease, lumbar   2. Cervical radiculitis   3. Cervicalgia   4. Bilateral sciatica   5. DDD (degenerative disc disease), lumbar   6. Chronic pain syndrome   7. Chronic, continuous use of opioids   Based on our discussion today and upon review of the [place] practitioner database information I am going to refill his medications for the next refill date of October 16.  No changes will be made.  I want him to continue with his stretching strengthening exercises.  We have talked about options regarding possible trigger point injections to the cervical musculature and a possible repeat epidural if indicated in the future.  In the meantime he is to continue follow-up with his primary care physicians for his baseline medical care with return to clinic in 1 month.   Follow Up Instructions:    I discussed the assessment and treatment plan with the patient. The patient was provided an opportunity to ask questions  and all were answered. The patient agreed with the plan and demonstrated an understanding of the instructions.   The patient was advised to call back or seek an in-person evaluation if the symptoms worsen or if the condition fails to improve as anticipated.  I provided 30 minutes of non-face-to-face time during this encounter.   Molli Barrows, MD

## 2019-03-17 ENCOUNTER — Ambulatory Visit: Payer: Medicare Other | Attending: Anesthesiology | Admitting: Anesthesiology

## 2019-03-17 ENCOUNTER — Other Ambulatory Visit: Payer: Self-pay

## 2019-03-17 ENCOUNTER — Encounter: Payer: Self-pay | Admitting: Anesthesiology

## 2019-03-17 DIAGNOSIS — M5431 Sciatica, right side: Secondary | ICD-10-CM | POA: Diagnosis not present

## 2019-03-17 DIAGNOSIS — M5412 Radiculopathy, cervical region: Secondary | ICD-10-CM

## 2019-03-17 DIAGNOSIS — M542 Cervicalgia: Secondary | ICD-10-CM | POA: Diagnosis not present

## 2019-03-17 DIAGNOSIS — M5136 Other intervertebral disc degeneration, lumbar region: Secondary | ICD-10-CM | POA: Diagnosis not present

## 2019-03-17 DIAGNOSIS — G894 Chronic pain syndrome: Secondary | ICD-10-CM

## 2019-03-17 DIAGNOSIS — M5432 Sciatica, left side: Secondary | ICD-10-CM

## 2019-03-17 DIAGNOSIS — F1190 Opioid use, unspecified, uncomplicated: Secondary | ICD-10-CM

## 2019-03-17 MED ORDER — HYDROCODONE-ACETAMINOPHEN 10-325 MG PO TABS: 1.0000 | ORAL_TABLET | Freq: Four times a day (QID) | ORAL | 0 refills | Status: DC | PRN

## 2019-03-21 NOTE — Progress Notes (Signed)
Virtual Visit via Telephone Note  I reached out to  [employer]. by telephone.  Location: Patient: Home Provider: Pain control center      History of Present Illness: I reached out to Mr. Schmick today via telephone for his pain management appointment.  I was unable to reach him but caught his voicemail left a message for him to touch base with [place] at his earliest convenience for follow-up.    Observations/Objective: Current Outpatient Medications:  .  buPROPion (WELLBUTRIN SR) 150 MG 12 hr tablet, Take by mouth., Disp: , Rfl:  .  BYSTOLIC 10 MG tablet, , Disp: , Rfl:  .  docusate sodium (COLACE) 100 MG capsule, Take by mouth., Disp: , Rfl:  .  fluticasone (FLONASE) 50 MCG/ACT nasal spray, , Disp: , Rfl:  .  hydrochlorothiazide (HYDRODIURIL) 25 MG tablet, Take 25 mg by mouth daily., Disp: , Rfl:  .  [START ON 03/30/2019] HYDROcodone-acetaminophen (NORCO) 10-325 MG tablet, Take 1 tablet by mouth every 6 (six) hours as needed for moderate pain or severe pain., Disp: 120 tablet, Rfl: 0 .  lisinopril (PRINIVIL,ZESTRIL) 20 MG tablet, Take 20 mg by mouth daily., Disp: , Rfl:  .  lovastatin (MEVACOR) 20 MG tablet, , Disp: , Rfl:  .  meloxicam (MOBIC) 15 MG tablet, Take by mouth., Disp: , Rfl:  .  NEXIUM 40 MG capsule, , Disp: , Rfl:  .  traMADol (ULTRAM) 50 MG tablet, Take by mouth., Disp: , Rfl:    Assessment and Plan: 1. Degenerative disc disease, lumbar   2. Cervical radiculitis   3. Cervicalgia   4. Chronic pain syndrome   5. DDD (degenerative disc disease), lumbar   6. Bilateral sciatica   7. Chronic, continuous use of opioids   I have reviewed the [facility] practitioner database information and it is appropriate.  We will touch base with Mr. Mustoe and hopefully get his prescriptions refilled for November 15 which is his next due date.  Furthermore we will likely move him out to every 34-month follow-up with a rescheduled appointment in the next 2 months.   Follow  Up Instructions:    I discussed the assessment and treatment plan with the patient. The patient was provided an opportunity to ask questions and all were answered. The patient agreed with the plan and demonstrated an understanding of the instructions.   The patient was advised to call back or seek an in-person evaluation if the symptoms worsen or if the condition fails to improve as anticipated.  I provided 20 minutes of non-face-to-face time during this encounter.   Molli Barrows, MD

## 2019-04-07 ENCOUNTER — Other Ambulatory Visit: Payer: Self-pay

## 2019-04-07 ENCOUNTER — Encounter: Payer: Self-pay | Admitting: Anesthesiology

## 2019-04-07 ENCOUNTER — Ambulatory Visit: Payer: Medicare Other | Attending: Anesthesiology | Admitting: Anesthesiology

## 2019-04-07 DIAGNOSIS — M5431 Sciatica, right side: Secondary | ICD-10-CM

## 2019-04-07 DIAGNOSIS — M542 Cervicalgia: Secondary | ICD-10-CM | POA: Diagnosis not present

## 2019-04-07 DIAGNOSIS — M5432 Sciatica, left side: Secondary | ICD-10-CM

## 2019-04-07 DIAGNOSIS — M5136 Other intervertebral disc degeneration, lumbar region: Secondary | ICD-10-CM

## 2019-04-07 DIAGNOSIS — M5412 Radiculopathy, cervical region: Secondary | ICD-10-CM

## 2019-04-07 DIAGNOSIS — G894 Chronic pain syndrome: Secondary | ICD-10-CM | POA: Diagnosis not present

## 2019-04-07 DIAGNOSIS — F1190 Opioid use, unspecified, uncomplicated: Secondary | ICD-10-CM

## 2019-04-07 MED ORDER — HYDROCODONE-ACETAMINOPHEN 10-325 MG PO TABS: 1.0000 | ORAL_TABLET | Freq: Four times a day (QID) | ORAL | 0 refills | Status: AC | PRN

## 2019-04-07 MED ORDER — HYDROCODONE-ACETAMINOPHEN 10-325 MG PO TABS: 1.0000 | ORAL_TABLET | Freq: Four times a day (QID) | ORAL | 0 refills | Status: DC | PRN

## 2019-04-07 NOTE — Progress Notes (Signed)
Virtual Visit via Telephone Note  I connected with Ryan Price. on 04/07/19 at  1:45 PM EST by telephone and verified that I am speaking with the correct person using two identifiers.  Location: Patient: Home Provider: Pain control center   I discussed the limitations, risks, security and privacy concerns of performing an evaluation and management service by telephone and the availability of in person appointments. I also discussed with the patient that there may be a patient responsible charge related to this service. The patient expressed understanding and agreed to proceed.   History of Present Illness: I spoke with Ryan Price today regarding his low back pain and bilateral leg pain.  He has been doing better but has had a recent recurrence of his low back pain.  He continues to have pain in the low back but this is generally well-tolerated with his medication management.  He started to get more pain in the posterior left leg which did respond favorably to previous epidural steroid injections and he would like to reschedule a repeat injection in the next 2 months.  His right lower pain has been more of an intermittent problem.  No change in lower extremity strength or function or bowel or bladder function are noted.  He continues to get good relief with the medications and sleep better and function better with no side effects reported.   Observations/Objective: Current Outpatient Medications:  .  buPROPion (WELLBUTRIN SR) 150 MG 12 hr tablet, Take by mouth., Disp: , Rfl:  .  BYSTOLIC 10 MG tablet, , Disp: , Rfl:  .  docusate sodium (COLACE) 100 MG capsule, Take by mouth., Disp: , Rfl:  .  fluticasone (FLONASE) 50 MCG/ACT nasal spray, , Disp: , Rfl:  .  hydrochlorothiazide (HYDRODIURIL) 25 MG tablet, Take 25 mg by mouth daily., Disp: , Rfl:  .  [START ON 04/29/2019] HYDROcodone-acetaminophen (NORCO) 10-325 MG tablet, Take 1 tablet by mouth every 6 (six) hours as needed for moderate  pain or severe pain., Disp: 120 tablet, Rfl: 0 .  [START ON 05/29/2019] HYDROcodone-acetaminophen (NORCO) 10-325 MG tablet, Take 1 tablet by mouth every 6 (six) hours as needed for moderate pain or severe pain., Disp: 120 tablet, Rfl: 0 .  lisinopril (PRINIVIL,ZESTRIL) 20 MG tablet, Take 20 mg by mouth daily., Disp: , Rfl:  .  lovastatin (MEVACOR) 20 MG tablet, , Disp: , Rfl:  .  meloxicam (MOBIC) 15 MG tablet, Take by mouth., Disp: , Rfl:  .  NEXIUM 40 MG capsule, , Disp: , Rfl:  .  traMADol (ULTRAM) 50 MG tablet, Take by mouth., Disp: , Rfl:    Assessment and Plan:  1. Degenerative disc disease, lumbar   2. Cervical radiculitis   3. Cervicalgia   4. DDD (degenerative disc disease), lumbar   5. Chronic pain syndrome   6. Chronic, continuous use of opioids   7. Bilateral sciatica   Based on our discussion today I am going to refill his medications for the next 2 months this be dated for December and January and this is upon review of the [facility] practitioner database information.  Also schedule him for return to clinic in 2 months with an epidural steroid at that time to see if we can improve upon his low back pain and address the left lower extremity pain.  I want him to continue with stretching strengthening exercises and continue follow-up with the primary care physicians for his baseline medical care.  The patient is instructed to  contact the pain control center for any pain related problems. Follow Up Instructions:    I discussed the assessment and treatment plan with the patient. The patient was provided an opportunity to ask questions and all were answered. The patient agreed with the plan and demonstrated an understanding of the instructions.   The patient was advised to call back or seek an in-person evaluation if the symptoms worsen or if the condition fails to improve as anticipated.  I provided 30 minutes of non-face-to-face time during this encounter.   Molli Barrows,  MD

## 2019-06-03 ENCOUNTER — Other Ambulatory Visit: Payer: Self-pay

## 2019-06-03 ENCOUNTER — Other Ambulatory Visit: Payer: Self-pay | Admitting: Anesthesiology

## 2019-06-03 ENCOUNTER — Encounter: Payer: Self-pay | Admitting: Anesthesiology

## 2019-06-03 ENCOUNTER — Ambulatory Visit
Admission: RE | Admit: 2019-06-03 | Discharge: 2019-06-03 | Disposition: A | Discharge status: Home / Self Care | Payer: Medicare HMO | Source: Ambulatory Visit | Attending: Anesthesiology | Admitting: Anesthesiology

## 2019-06-03 ENCOUNTER — Ambulatory Visit (HOSPITAL_BASED_OUTPATIENT_CLINIC_OR_DEPARTMENT_OTHER): Payer: Medicare HMO | Admitting: Anesthesiology

## 2019-06-03 VITALS — BP 130/78 | HR 80 | Temp 97.5°F | Resp 18 | Ht 75.0 in | Wt 261.0 lb

## 2019-06-03 DIAGNOSIS — M542 Cervicalgia: Secondary | ICD-10-CM | POA: Diagnosis not present

## 2019-06-03 DIAGNOSIS — G894 Chronic pain syndrome: Secondary | ICD-10-CM | POA: Insufficient documentation

## 2019-06-03 DIAGNOSIS — M5431 Sciatica, right side: Secondary | ICD-10-CM | POA: Diagnosis not present

## 2019-06-03 DIAGNOSIS — R52 Pain, unspecified: Secondary | ICD-10-CM

## 2019-06-03 DIAGNOSIS — M5136 Other intervertebral disc degeneration, lumbar region: Secondary | ICD-10-CM | POA: Diagnosis not present

## 2019-06-03 DIAGNOSIS — M5432 Sciatica, left side: Secondary | ICD-10-CM

## 2019-06-03 DIAGNOSIS — F1190 Opioid use, unspecified, uncomplicated: Secondary | ICD-10-CM | POA: Insufficient documentation

## 2019-06-03 DIAGNOSIS — M51369 Other intervertebral disc degeneration, lumbar region without mention of lumbar back pain or lower extremity pain: Secondary | ICD-10-CM

## 2019-06-03 MED ORDER — MIDAZOLAM HCL 5 MG/5ML IJ SOLN
INTRAMUSCULAR | Status: AC
  Filled 2019-06-03: qty 5

## 2019-06-03 MED ORDER — DEXAMETHASONE SODIUM PHOSPHATE 10 MG/ML IJ SOLN
10.0000 mg | Freq: Once | INTRAMUSCULAR | Status: AC
  Administered 2019-06-03: 10 mg

## 2019-06-03 MED ORDER — SODIUM CHLORIDE (PF) 0.9 % IJ SOLN
INTRAMUSCULAR | Status: AC
  Filled 2019-06-03: qty 10

## 2019-06-03 MED ORDER — SODIUM CHLORIDE 0.9% FLUSH
10.0000 mL | Freq: Once | INTRAVENOUS | Status: AC
  Administered 2019-06-03: 10 mL

## 2019-06-03 MED ORDER — ROPIVACAINE HCL 2 MG/ML IJ SOLN
INTRAMUSCULAR | Status: AC
  Filled 2019-06-03: qty 10

## 2019-06-03 MED ORDER — HYDROCODONE-ACETAMINOPHEN 10-325 MG PO TABS: 1.0000 | ORAL_TABLET | Freq: Four times a day (QID) | ORAL | 0 refills | Status: DC | PRN

## 2019-06-03 MED ORDER — LIDOCAINE HCL (PF) 1 % IJ SOLN
INTRAMUSCULAR | Status: AC
  Filled 2019-06-03: qty 10

## 2019-06-03 MED ORDER — MIDAZOLAM HCL 2 MG/2ML IJ SOLN
5.0000 mg | Freq: Once | INTRAMUSCULAR | Status: AC
  Administered 2019-06-03: 2 mg via INTRAVENOUS
  Filled 2019-06-03: qty 5

## 2019-06-03 MED ORDER — IOPAMIDOL (ISOVUE-M 200) INJECTION 41%
20.0000 mL | Freq: Once | INTRAMUSCULAR | Status: DC | PRN
  Administered 2019-06-03: 20 mL

## 2019-06-03 MED ORDER — LACTATED RINGERS IV SOLN
1000.0000 mL | INTRAVENOUS | Status: DC
  Administered 2019-06-03: 1000 mL via INTRAVENOUS

## 2019-06-03 MED ORDER — LIDOCAINE HCL (PF) 1 % IJ SOLN
5.0000 mL | Freq: Once | INTRAMUSCULAR | Status: AC
  Administered 2019-06-03: 5 mL via SUBCUTANEOUS

## 2019-06-03 MED ORDER — TRIAMCINOLONE ACETONIDE 40 MG/ML IJ SUSP
INTRAMUSCULAR | Status: AC
  Filled 2019-06-03: qty 1

## 2019-06-03 MED ORDER — ROPIVACAINE HCL 2 MG/ML IJ SOLN
10.0000 mL | Freq: Once | INTRAMUSCULAR | Status: AC
  Administered 2019-06-03: 1 mL via EPIDURAL

## 2019-06-03 MED ORDER — TRIAMCINOLONE ACETONIDE 40 MG/ML IJ SUSP
40.0000 mg | Freq: Once | INTRAMUSCULAR | Status: AC
  Administered 2019-06-03: 40 mg

## 2019-06-03 MED ORDER — CYCLOBENZAPRINE HCL 10 MG PO TABS: 10.0000 mg | ORAL_TABLET | Freq: Two times a day (BID) | ORAL | 3 refills | Status: AC

## 2019-06-03 MED ORDER — HYDROCODONE-ACETAMINOPHEN 10-325 MG PO TABS: 1.0000 | ORAL_TABLET | Freq: Four times a day (QID) | ORAL | 0 refills | Status: AC | PRN

## 2019-06-03 MED ORDER — IOHEXOL 180 MG/ML  SOLN
INTRAMUSCULAR | Status: AC
  Filled 2019-06-03: qty 20

## 2019-06-03 MED ORDER — DEXAMETHASONE SODIUM PHOSPHATE 10 MG/ML IJ SOLN
INTRAMUSCULAR | Status: AC
  Filled 2019-06-03: qty 1

## 2019-06-03 NOTE — Progress Notes (Signed)
Nursing Pain Medication Assessment:  Safety precautions to be maintained throughout the outpatient stay will include: orient to surroundings, keep bed in low position, maintain call bell within reach at all times, provide assistance with transfer out of bed and ambulation.  Medication Inspection Compliance: Pill count conducted under aseptic conditions, in front of the patient. Neither the pills nor the bottle was removed from the patient's sight at any time. Once count was completed pills were immediately returned to the patient in their original bottle.  Medication: Hydrocodone/APAP Pill/Patch Count: 96 of 120 pills remain Pill/Patch Appearance: Markings consistent with prescribed medication Bottle Appearance: Standard pharmacy container. Clearly labeled. Filled Date: 1 / 64 / 2021 Last Medication int

## 2019-06-03 NOTE — Progress Notes (Signed)
Subjective:  Patient ID: Ryan Price., male    DOB: January 21, 1961  Age: 59 y.o. MRN: [id number]  CC: Neck Pain (right)   Procedure: L5-S1 epidural steroid under fluoroscopic guidance with moderate sedation and trigger point injection x2 to the cervical musculature at C8 level  HPI Ryan Price. presents for reevaluation.  He was last seen via virtual visit and has had some recurrent low back pain with right posterior lateral leg pain.  He has had previous epidural steroid injections and reports that he is done very well with these generally getting approximately 75% improvement.  His last injection was in July 2020 and he is continued to do reasonably well but over the past month or so he has had gradual recurrence of the right lower back pain with radiation of the right posterior lateral leg and some bilateral calf cramping.  No new changes in motor or sensory function or bowel or bladder function are noted and is taking his medications as prescribed and these continue to work well for him.  He is taking his hydrocodone 4 times a day with no side effects and gets good relief based on her narcotic assessment sheet and continues to derive good functional lifestyle improvement with the medications.  He is also describing some persistent neck pain worse with extension and lateral rotation of the neck at the atlantooccipital joint.  He has tried stretching strengthening exercises without much improvement in his medications or not giving him much relief there.    Outpatient Medications Prior to Visit  Medication Sig Dispense Refill  . BYSTOLIC 10 MG tablet     . fluticasone (FLONASE) 50 MCG/ACT nasal spray     . lisinopril (PRINIVIL,ZESTRIL) 20 MG tablet Take 20 mg by mouth daily.    Marland Kitchen lovastatin (MEVACOR) 20 MG tablet     . NEXIUM 40 MG capsule     . HYDROcodone-acetaminophen (NORCO) 10-325 MG tablet Take 1 tablet by mouth every 6 (six) hours as needed for moderate pain or severe pain.  120 tablet 0  . buPROPion (WELLBUTRIN SR) 150 MG 12 hr tablet Take by mouth.    . docusate sodium (COLACE) 100 MG capsule Take by mouth.    . hydrochlorothiazide (HYDRODIURIL) 25 MG tablet Take 25 mg by mouth daily.    . meloxicam (MOBIC) 15 MG tablet Take by mouth.    . traMADol (ULTRAM) 50 MG tablet Take by mouth.     No facility-administered medications prior to visit.    Review of Systems CNS: No confusion or sedation Cardiac: No angina or palpitations GI: No abdominal pain or constipation Constitutional: No nausea vomiting fevers or chills  Objective:  BP 109/79   Pulse 80   Temp 97.9 F (36.6 C) Comment: temp  Resp 16   Ht 6\' 3"  (1.905 m)   Wt 261 lb (118.4 kg)   SpO2 96%   BMI 32.62 kg/m    BP Readings from Last 3 Encounters:  06/03/19 109/79  12/25/18 119/88  11/27/18 120/73     Wt Readings from Last 3 Encounters:  06/03/19 261 lb (118.4 kg)  12/25/18 263 lb (119.3 kg)  11/27/18 262 lb (118.8 kg)     Physical Exam Pt is alert and oriented PERRL EOMI HEART IS RRR no murmur or rub LCTA no wheezing or rales MUSCULOSKELETAL reveals 2 trigger points in the splenius capitis muscles approximately C7-C8 level 1 cm from the midline bilaterally.  This does reproduce the mainstay of his  cervical pain.  Trigger points are noted in the trapezius musculature.  He has got good muscle tone and bulk in this region good range of motion at the glenohumeral joints as well.  At the lumbar level he has some paraspinous muscle tenderness but no overt trigger points.  He is ambulating with an antalgic gait.  He does have a positive straight leg raise right side.  Labs  No results found for: HGBA1C Lab Results  Component Value Date   CREATININE 0.81 05/26/2015    -------------------------------------------------------------------------------------------------------------------- Lab Results  Component Value Date   WBC 13.6 (H) 05/26/2015   HGB 13.9 04/26/2016   HCT 48.0  05/26/2015   PLT 242 05/26/2015   GLUCOSE 117 (H) 05/26/2015   ALT 28 05/26/2015   AST 21 05/26/2015   NA 138 05/26/2015   K 4.1 05/26/2015   CL 112 (H) 05/26/2015   CREATININE 0.81 05/26/2015   BUN 13 05/26/2015   CO2 21 (L) 05/26/2015    --------------------------------------------------------------------------------------------------------------------- No results found.   Assessment & Plan:   Rollyn was seen today for neck pain.  Diagnoses and all orders for this visit:  Cervicalgia -     Ambulatory referral to Physical Therapy  Other orders -     HYDROcodone-acetaminophen (NORCO) 10-325 MG tablet; Take 1 tablet by mouth every 6 (six) hours as needed for moderate pain or severe pain. -     HYDROcodone-acetaminophen (NORCO) 10-325 MG tablet; Take 1 tablet by mouth every 6 (six) hours as needed for moderate pain or severe pain. -     cyclobenzaprine (FLEXERIL) 10 MG tablet; Take 1 tablet (10 mg total) by mouth 2 (two) times daily. -     triamcinolone acetonide (KENALOG-40) injection 40 mg -     sodium chloride flush (NS) 0.9 % injection 10 mL -     ropivacaine (PF) 2 mg/mL (0.2%) (NAROPIN) injection 10 mL -     midazolam (VERSED) injection 5 mg -     lidocaine (PF) (XYLOCAINE) 1 % injection 5 mL -     iopamidol (ISOVUE-M) 41 % intrathecal injection 20 mL -     dexamethasone (DECADRON) injection 10 mg -     lactated ringers infusion 1,000 mL        ----------------------------------------------------------------------------------------------------------------------  Problem List Items Addressed This Visit    None    Visit Diagnoses    Cervicalgia    -  Primary   Relevant Orders   Ambulatory referral to Physical Therapy        ----------------------------------------------------------------------------------------------------------------------  1. Cervicalgia I have encouraged him to continue with stretching strengthening exercises and continue with his  current medication management.  I am going to refer him to physical therapy for evaluation and treatment with potential for traction application for the cervicalgia and possible TENS unit application. - Ambulatory referral to Physical Therapy 1. Cervicalgia   2. Degenerative disc disease, lumbar   3. DDD (degenerative disc disease), lumbar   4. Bilateral sciatica   5. Chronic pain syndrome   6. Chronic, continuous use of opioids   Based on the above findings we will refill his medications after review of the [facility] practitioner database information.  This will be dated for February 13 and March 14 as he appears to be doing well with his current medication regimen with no side effects reported and good relief.  I am also going to proceed with an epidural steroid injection today.  Of gone over the risks and benefits of this  with him in full detail and this is per his request.  On return to clinic in 2 months for return visit.  I want him to continue follow-up with his primary care physicians for his baseline medical care and continue active stretching strengthening exercises and ambulatory efforts for core strengthening.   ----------------------------------------------------------------------------------------------------------------------  I am having Ryan Price. start on HYDROcodone-acetaminophen and cyclobenzaprine. I am also having him maintain his lisinopril, buPROPion, docusate sodium, meloxicam, traMADol, Bystolic, NexIUM, hydrochlorothiazide, fluticasone, lovastatin, and HYDROcodone-acetaminophen.   Meds ordered this encounter  Medications  . HYDROcodone-acetaminophen (NORCO) 10-325 MG tablet    Sig: Take 1 tablet by mouth every 6 (six) hours as needed for moderate pain or severe pain.    Dispense:  120 tablet    Refill:  0  . HYDROcodone-acetaminophen (NORCO) 10-325 MG tablet    Sig: Take 1 tablet by mouth every 6 (six) hours as needed for moderate pain or severe pain.     Dispense:  120 tablet    Refill:  0  . cyclobenzaprine (FLEXERIL) 10 MG tablet    Sig: Take 1 tablet (10 mg total) by mouth 2 (two) times daily.    Dispense:  60 tablet    Refill:  3  . triamcinolone acetonide (KENALOG-40) injection 40 mg  . sodium chloride flush (NS) 0.9 % injection 10 mL  . ropivacaine (PF) 2 mg/mL (0.2%) (NAROPIN) injection 10 mL  . midazolam (VERSED) injection 5 mg  . lidocaine (PF) (XYLOCAINE) 1 % injection 5 mL  . iopamidol (ISOVUE-M) 41 % intrathecal injection 20 mL  . dexamethasone (DECADRON) injection 10 mg  . lactated ringers infusion 1,000 mL   Patient's Medications  New Prescriptions   CYCLOBENZAPRINE (FLEXERIL) 10 MG TABLET    Take 1 tablet (10 mg total) by mouth 2 (two) times daily.   HYDROCODONE-ACETAMINOPHEN (NORCO) 10-325 MG TABLET    Take 1 tablet by mouth every 6 (six) hours as needed for moderate pain or severe pain.  Previous Medications   BUPROPION (WELLBUTRIN SR) 150 MG 12 HR TABLET    Take by mouth.   BYSTOLIC 10 MG TABLET       DOCUSATE SODIUM (COLACE) 100 MG CAPSULE    Take by mouth.   FLUTICASONE (FLONASE) 50 MCG/ACT NASAL SPRAY       HYDROCHLOROTHIAZIDE (HYDRODIURIL) 25 MG TABLET    Take 25 mg by mouth daily.   LISINOPRIL (PRINIVIL,ZESTRIL) 20 MG TABLET    Take 20 mg by mouth daily.   LOVASTATIN (MEVACOR) 20 MG TABLET       MELOXICAM (MOBIC) 15 MG TABLET    Take by mouth.   NEXIUM 40 MG CAPSULE       TRAMADOL (ULTRAM) 50 MG TABLET    Take by mouth.  Modified Medications   Modified Medication Previous Medication   HYDROCODONE-ACETAMINOPHEN (NORCO) 10-325 MG TABLET HYDROcodone-acetaminophen (NORCO) 10-325 MG tablet      Take 1 tablet by mouth every 6 (six) hours as needed for moderate pain or severe pain.    Take 1 tablet by mouth every 6 (six) hours as needed for moderate pain or severe pain.  Discontinued Medications   No medications on file    ----------------------------------------------------------------------------------------------------------------------  Follow-up: Return in about 2 months (around 08/01/2019) for evaluation, med refill.   Procedure: L5-S1 LESI with fluoroscopic guidance and with moderate sedation  NOTE: The risks, benefits, and expectations of the procedure have been discussed and explained to the patient who was understanding and in agreement  with suggested treatment plan. No guarantees were made.  DESCRIPTION OF PROCEDURE: Lumbar epidural steroid injection with 2 mg IV Versed, EKG, blood pressure, pulse, and pulse oximetry monitoring. The procedure was performed with the patient in the prone position under fluoroscopic guidance.  Sterile prep x3 was initiated and I then injected subcutaneous lidocaine to the overlying L5-S1 site after its fluoroscopic identifictation.  Using strict aseptic technique, I then advanced an 18-gauge Tuohy epidural needle in the midline using interlaminar approach via loss-of-resistance to saline technique. There was negative aspiration for heme or  CSF.  I then confirmed position with both AP and Lateral fluoroscan.  2 cc of contrast dye were injected and a  total of 5 mL of Preservative-Free normal saline mixed with 40 mg of Kenalog and 1cc Ropicaine 0.2 percent were injected incrementally via the  epidurally placed needle. The needle was removed. The patient tolerated the injection well and was convalesced and discharged to home in stable condition. Should the patient have any post procedure difficulty they have been instructed on how to contact us for assistance.    Molli Barrows, MD

## 2019-06-03 NOTE — Patient Instructions (Signed)

## 2019-08-18 ENCOUNTER — Ambulatory Visit: Payer: Medicare HMO | Attending: Anesthesiology | Admitting: Anesthesiology

## 2019-08-18 ENCOUNTER — Encounter: Payer: Self-pay | Admitting: Anesthesiology

## 2019-08-18 ENCOUNTER — Other Ambulatory Visit: Payer: Self-pay

## 2019-08-18 DIAGNOSIS — M47816 Spondylosis without myelopathy or radiculopathy, lumbar region: Secondary | ICD-10-CM

## 2019-08-18 DIAGNOSIS — G894 Chronic pain syndrome: Secondary | ICD-10-CM | POA: Diagnosis not present

## 2019-08-18 DIAGNOSIS — M5136 Other intervertebral disc degeneration, lumbar region: Secondary | ICD-10-CM | POA: Diagnosis not present

## 2019-08-18 DIAGNOSIS — M542 Cervicalgia: Secondary | ICD-10-CM | POA: Diagnosis not present

## 2019-08-18 DIAGNOSIS — F1190 Opioid use, unspecified, uncomplicated: Secondary | ICD-10-CM

## 2019-08-18 MED ORDER — HYDROCODONE-ACETAMINOPHEN 10-325 MG PO TABS: 1.0000 | ORAL_TABLET | Freq: Four times a day (QID) | ORAL | 0 refills | Status: DC | PRN
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
## 2019-09-16 ENCOUNTER — Other Ambulatory Visit: Payer: Self-pay

## 2019-09-16 ENCOUNTER — Ambulatory Visit (HOSPITAL_BASED_OUTPATIENT_CLINIC_OR_DEPARTMENT_OTHER): Payer: Medicare HMO | Admitting: Anesthesiology

## 2019-09-16 ENCOUNTER — Other Ambulatory Visit: Payer: Self-pay | Admitting: Anesthesiology

## 2019-09-16 ENCOUNTER — Ambulatory Visit
Admission: RE | Admit: 2019-09-16 | Discharge: 2019-09-16 | Disposition: A | Discharge status: Home / Self Care | Payer: Medicare HMO | Source: Ambulatory Visit | Attending: Anesthesiology | Admitting: Anesthesiology

## 2019-09-16 ENCOUNTER — Encounter: Payer: Self-pay | Admitting: Anesthesiology

## 2019-09-16 VITALS — BP 110/80 | HR 78 | Temp 98.7°F | Resp 16 | Ht 75.0 in | Wt 262.0 lb

## 2019-09-16 DIAGNOSIS — M5136 Other intervertebral disc degeneration, lumbar region: Secondary | ICD-10-CM | POA: Diagnosis not present

## 2019-09-16 DIAGNOSIS — Z791 Long term (current) use of non-steroidal anti-inflammatories (NSAID): Secondary | ICD-10-CM | POA: Diagnosis not present

## 2019-09-16 DIAGNOSIS — G894 Chronic pain syndrome: Secondary | ICD-10-CM

## 2019-09-16 DIAGNOSIS — R52 Pain, unspecified: Secondary | ICD-10-CM

## 2019-09-16 DIAGNOSIS — F1190 Opioid use, unspecified, uncomplicated: Secondary | ICD-10-CM

## 2019-09-16 DIAGNOSIS — M47816 Spondylosis without myelopathy or radiculopathy, lumbar region: Secondary | ICD-10-CM | POA: Diagnosis not present

## 2019-09-16 DIAGNOSIS — Z79891 Long term (current) use of opiate analgesic: Secondary | ICD-10-CM | POA: Insufficient documentation

## 2019-09-16 MED ORDER — LACTATED RINGERS IV SOLN
1000.0000 mL | INTRAVENOUS | Status: DC
  Administered 2019-09-16: 14:00:00 1000 mL via INTRAVENOUS

## 2019-09-16 MED ORDER — MIDAZOLAM HCL 5 MG/5ML IJ SOLN
INTRAMUSCULAR | Status: AC
  Filled 2019-09-16: qty 5

## 2019-09-16 MED ORDER — FENTANYL CITRATE (PF) 100 MCG/2ML IJ SOLN
100.0000 ug | Freq: Once | INTRAMUSCULAR | Status: AC
  Administered 2019-09-16: 14:00:00 75 ug via INTRAVENOUS
  Filled 2019-09-16: qty 2

## 2019-09-16 MED ORDER — SODIUM CHLORIDE 0.9% FLUSH: 10.0000 mL | Freq: Once | INTRAVENOUS | Status: DC

## 2019-09-16 MED ORDER — HYDROCODONE-ACETAMINOPHEN 10-325 MG PO TABS: 1.0000 | ORAL_TABLET | Freq: Four times a day (QID) | ORAL | 0 refills | Status: DC | PRN

## 2019-09-16 MED ORDER — ROPIVACAINE HCL 2 MG/ML IJ SOLN
10.0000 mL | Freq: Once | INTRAMUSCULAR | Status: AC
  Administered 2019-09-16: 10 mL via EPIDURAL
  Filled 2019-09-16: qty 10

## 2019-09-16 MED ORDER — TRIAMCINOLONE ACETONIDE 40 MG/ML IJ SUSP
40.0000 mg | Freq: Once | INTRAMUSCULAR | Status: AC
  Administered 2019-09-16: 14:00:00 40 mg
  Filled 2019-09-16: qty 1

## 2019-09-16 MED ORDER — MIDAZOLAM HCL 2 MG/2ML IJ SOLN
5.0000 mg | Freq: Once | INTRAMUSCULAR | Status: AC
  Administered 2019-09-16: 3 mg via INTRAVENOUS
  Filled 2019-09-16: qty 5

## 2019-09-16 MED ORDER — LIDOCAINE HCL (PF) 1 % IJ SOLN
5.0000 mL | Freq: Once | INTRAMUSCULAR | Status: AC
  Administered 2019-09-16: 14:00:00 10 mL via SUBCUTANEOUS
  Filled 2019-09-16: qty 5

## 2019-09-16 NOTE — Progress Notes (Signed)
Subjective:  Patient ID: Ryan Price., male    DOB: 05-01-1961  Age: 59 y.o. MRN: [id number]  CC: Back Pain (right and low)   Procedure: Right side medial branch block to the lumbar facets at L2-3 L3-4 L4-5 and L5-S1 under fluoroscopic guidance with moderate sedation  HPI Ryan Price. presents for reevaluation.  He was last seen few months ago and has had an epidural back in January.  This eliminated most of his right lower extremity pain but he is continuing to have right side facet type pain.  He has pain with prolonged standing and certain rotational movements.  This pain is described as a aching gnawing type pain worse with rotation which radiates into the buttocks hip right side and posterior leg.  He is currently not experiencing any right calf pain.  His motor strength to the lower extremities has been good and no problems with bowel or bladder function are noted.  He is taking his medications as prescribed and these continue to work well for him.  Otherwise he is in his usual state of health.  He has tried to do stretching strengthening exercises with minimal relief and showing no progress with that.  He desires to proceed with a lumbar facet block to see if this can be of diagnostic and therapeutic benefit as discussed with him today.  Outpatient Medications Prior to Visit  Medication Sig Dispense Refill  . buPROPion (WELLBUTRIN SR) 150 MG 12 hr tablet Take by mouth.    . BYSTOLIC 10 MG tablet     . fluticasone (FLONASE) 50 MCG/ACT nasal spray     . HYDROcodone-acetaminophen (NORCO) 10-325 MG tablet Take 1 tablet by mouth every 6 (six) hours as needed for moderate pain or severe pain. 120 tablet 0  . [START ON 09/26/2019] HYDROcodone-acetaminophen (NORCO) 10-325 MG tablet Take 1 tablet by mouth every 6 (six) hours as needed for moderate pain or severe pain. 120 tablet 0  . lisinopril (PRINIVIL,ZESTRIL) 20 MG tablet Take 20 mg by mouth daily.    Marland Kitchen lovastatin (MEVACOR) 20  MG tablet     . omeprazole (PRILOSEC) 40 MG capsule Take 40 mg by mouth daily.    Marland Kitchen docusate sodium (COLACE) 100 MG capsule Take by mouth.    . hydrochlorothiazide (HYDRODIURIL) 25 MG tablet Take 25 mg by mouth daily.    . meloxicam (MOBIC) 15 MG tablet Take by mouth.    Marland Kitchen NEXIUM 40 MG capsule     . traMADol (ULTRAM) 50 MG tablet Take by mouth.     No facility-administered medications prior to visit.    Review of Systems CNS: No confusion or sedation Cardiac: No angina or palpitations GI: No abdominal pain or constipation Constitutional: No nausea vomiting fevers or chills  Objective:  BP (!) 149/99   Pulse 78   Temp 98.7 F (37.1 C) (Oral)   Ht 6\' 3"  (1.905 m)   Wt 262 lb (118.8 kg)   BMI 32.75 kg/m    BP Readings from Last 3 Encounters:  09/16/19 (!) 149/99  06/03/19 130/78  12/25/18 119/88     Wt Readings from Last 3 Encounters:  09/16/19 262 lb (118.8 kg)  06/03/19 261 lb (118.4 kg)  12/25/18 263 lb (119.3 kg)     Physical Exam Pt is alert and oriented PERRL EOMI HEART IS RRR no murmur or rub LCTA no wheezing or rales MUSCULOSKELETAL reveals some paraspinous muscle tenderness but no overt trigger points.  He has  pain on extension with right lateral rotation at the lumbar spine.  This reproduces his primary pain complaint.  Left lateral rotation does not produce pain.  He has a negative straight leg raise at this point and his muscle tone and bulk is at baseline.  Labs  No results found for: HGBA1C Lab Results  Component Value Date   CREATININE 0.81 05/26/2015    -------------------------------------------------------------------------------------------------------------------- Lab Results  Component Value Date   WBC 13.6 (H) 05/26/2015   HGB 13.9 04/26/2016   HCT 48.0 05/26/2015   PLT 242 05/26/2015   GLUCOSE 117 (H) 05/26/2015   ALT 28 05/26/2015   AST 21 05/26/2015   NA 138 05/26/2015   K 4.1 05/26/2015   CL 112 (H) 05/26/2015   CREATININE  0.81 05/26/2015   BUN 13 05/26/2015   CO2 21 (L) 05/26/2015    --------------------------------------------------------------------------------------------------------------------- No results found.   Assessment & Plan:   Tevyn was seen today for back pain.  Diagnoses and all orders for this visit:  Degenerative disc disease, lumbar  Lumbar facet arthropathy -     LUMBAR FACET(MEDIAL BRANCH NERVE BLOCK) MBNB  Spondylosis of lumbar region without myelopathy or radiculopathy -     LUMBAR FACET(MEDIAL BRANCH NERVE BLOCK) MBNB  Chronic, continuous use of opioids  Chronic pain syndrome  Other orders -     triamcinolone acetonide (KENALOG-40) injection 40 mg -     sodium chloride flush (NS) 0.9 % injection 10 mL -     ropivacaine (PF) 2 mg/mL (0.2%) (NAROPIN) injection 10 mL -     midazolam (VERSED) injection 5 mg -     lidocaine (PF) (XYLOCAINE) 1 % injection 5 mL -     lactated ringers infusion 1,000 mL -     fentaNYL (SUBLIMAZE) injection 100 mcg        ----------------------------------------------------------------------------------------------------------------------  Problem List Items Addressed This Visit      Unprioritized   Chronic pain   Relevant Medications   triamcinolone acetonide (KENALOG-40) injection 40 mg   ropivacaine (PF) 2 mg/mL (0.2%) (NAROPIN) injection 10 mL   lidocaine (PF) (XYLOCAINE) 1 % injection 5 mL   fentaNYL (SUBLIMAZE) injection 100 mcg   Chronic, continuous use of opioids   Degenerative disc disease, lumbar - Primary   Relevant Medications   triamcinolone acetonide (KENALOG-40) injection 40 mg   fentaNYL (SUBLIMAZE) injection 100 mcg    Other Visit Diagnoses    Lumbar facet arthropathy       Spondylosis of lumbar region without myelopathy or radiculopathy       Relevant Medications   triamcinolone acetonide (KENALOG-40) injection 40 mg   fentaNYL (SUBLIMAZE) injection 100 mcg         ----------------------------------------------------------------------------------------------------------------------  1. Lumbar facet arthropathy We will proceed with a therapeutic and diagnostic lumbar facet block today.  Of gone over the risks and benefits of the procedure with him in full detail and all questions are answered.  I encouraged him to continue with stretching strengthening exercises as best tolerated and we will schedule him for return to clinic in 2 months.  I want him to continue with his current pain medication regimen which is well-tolerated and working well for him he reports. - LUMBAR FACET(MEDIAL BRANCH NERVE BLOCK) MBNB  2. Spondylosis of lumbar region without myelopathy or radiculopathy As above - LUMBAR FACET(MEDIAL BRANCH NERVE BLOCK) MBNB  3. Degenerative disc disease, lumbar As above  4. Chronic, continuous use of opioids I have reviewed the [facility] practitioner database  information and it is appropriate.  Refills will be given today for May 14 and June 13.  I have encouraged him to continue with his primary care physicians for his baseline medical care as well.  5. Chronic pain syndrome As above    ----------------------------------------------------------------------------------------------------------------------  I am having Ryan Price. maintain his lisinopril, buPROPion, docusate sodium, meloxicam, traMADol, Bystolic, NexIUM, hydrochlorothiazide, fluticasone, lovastatin, HYDROcodone-acetaminophen, HYDROcodone-acetaminophen, and omeprazole.   Meds ordered this encounter  Medications  . triamcinolone acetonide (KENALOG-40) injection 40 mg  . sodium chloride flush (NS) 0.9 % injection 10 mL  . ropivacaine (PF) 2 mg/mL (0.2%) (NAROPIN) injection 10 mL  . midazolam (VERSED) injection 5 mg  . lidocaine (PF) (XYLOCAINE) 1 % injection 5 mL  . lactated ringers infusion 1,000 mL  . fentaNYL (SUBLIMAZE) injection 100 mcg    Patient's Medications  New Prescriptions   No medications on file  Previous Medications   BUPROPION (WELLBUTRIN SR) 150 MG 12 HR TABLET    Take by mouth.   BYSTOLIC 10 MG TABLET       DOCUSATE SODIUM (COLACE) 100 MG CAPSULE    Take by mouth.   FLUTICASONE (FLONASE) 50 MCG/ACT NASAL SPRAY       HYDROCHLOROTHIAZIDE (HYDRODIURIL) 25 MG TABLET    Take 25 mg by mouth daily.   HYDROCODONE-ACETAMINOPHEN (NORCO) 10-325 MG TABLET    Take 1 tablet by mouth every 6 (six) hours as needed for moderate pain or severe pain.   HYDROCODONE-ACETAMINOPHEN (NORCO) 10-325 MG TABLET    Take 1 tablet by mouth every 6 (six) hours as needed for moderate pain or severe pain.   LISINOPRIL (PRINIVIL,ZESTRIL) 20 MG TABLET    Take 20 mg by mouth daily.   LOVASTATIN (MEVACOR) 20 MG TABLET       MELOXICAM (MOBIC) 15 MG TABLET    Take by mouth.   NEXIUM 40 MG CAPSULE       OMEPRAZOLE (PRILOSEC) 40 MG CAPSULE    Take 40 mg by mouth daily.   TRAMADOL (ULTRAM) 50 MG TABLET    Take by mouth.  Modified Medications   No medications on file  Discontinued Medications   No medications on file   ----------------------------------------------------------------------------------------------------------------------  Follow-up: Return in about 1 month (around 10/17/2019) for procedure.  Procedure: Right side lumbar medial branch block under fluoroscopic guidance at L2-3 L3-4 L4-5 L5-S1 with sedation Patient was taken to the fluoroscopy suite and placed in the prone position.Vital signs were stable throughout the procedure. The  overlying area of skin on the right side was prepped with Betadine 3 and strict aseptic technique was utilized throughout the procedure. Flouroscopy was used to  identify the areas overlying the aforementioned facets at  L2-3 L3-4  L4-5 and  L5-S1. 1% lidocaine 1 cc was infiltrated subcutaneously and into the fascia with a 25-gauge needle at each of these sites. I then advanced a 22-gauge 3-1/2 inch Quinckie  needle with the needle tip to lie at the "[facility]" portion of the [employer] dog". There was negative aspiration for heme or CSF and  no paresthesia. I then injected 2 cc of ropivacaine 0.2% mixed with 10 mg of triamcinolone at each of the aforementioned sites. These needles were withdrawn.  The patient tolerated the procedure without any difficulty and was convalesced discharged home in stable condition for follow-up as mentioned  Molli Barrows, MD

## 2019-09-16 NOTE — Patient Instructions (Signed)
GENERAL RISKS AND COMPLICATIONS  What are the risk, side effects and possible complications? Generally speaking, most procedures are safe.  However, with any procedure there are risks, side effects, and the possibility of complications.  The risks and complications are dependent upon the sites that are lesioned, or the type of nerve block to be performed.  The closer the procedure is to the spine, the more serious the risks are.  Great care is taken when placing the radio frequency needles, block needles or lesioning probes, but sometimes complications can occur. 1. Infection: Any time there is an injection through the skin, there is a risk of infection.  This is why sterile conditions are used for these blocks.  There are four possible types of infection. 1. Localized skin infection. 2. Central Nervous System Infection-This can be in the form of Meningitis, which can be deadly. 3. Epidural Infections-This can be in the form of an epidural abscess, which can cause pressure inside of the spine, causing compression of the spinal cord with subsequent paralysis. This would require an emergency surgery to decompress, and there are no guarantees that the patient would recover from the paralysis. 4. Discitis-This is an infection of the intervertebral discs.  It occurs in about 1% of discography procedures.  It is difficult to treat and it may lead to surgery.        2. Pain: the needles have to go through skin and soft tissues, will cause soreness.       3. Damage to internal structures:  The nerves to be lesioned may be near blood vessels or    other nerves which can be potentially damaged.       4. Bleeding: Bleeding is more common if the patient is taking blood thinners such as  aspirin, Coumadin, Ticiid, Plavix, etc., or if he/she have some genetic predisposition  such as hemophilia. Bleeding into the spinal canal can cause compression of the spinal  cord with subsequent paralysis.  This would require an  emergency surgery to  decompress and there are no guarantees that the patient would recover from the  paralysis.       5. Pneumothorax:  Puncturing of a lung is a possibility, every time a needle is introduced in  the area of the chest or upper back.  Pneumothorax refers to free air around the  collapsed lung(s), inside of the thoracic cavity (chest cavity).  Another two possible  complications related to a similar event would include: Hemothorax and Chylothorax.   These are variations of the Pneumothorax, where instead of air around the collapsed  lung(s), you may have blood or chyle, respectively.       6. Spinal headaches: They may occur with any procedures in the area of the spine.       7. Persistent CSF (Cerebro-Spinal Fluid) leakage: This is a rare problem, but may occur  with prolonged intrathecal or epidural catheters either due to the formation of a fistulous  track or a dural tear.       8. Nerve damage: By working so close to the spinal cord, there is always a possibility of  nerve damage, which could be as serious as a permanent spinal cord injury with  paralysis.       9. Death:  Although rare, severe deadly allergic reactions known as "Anaphylactic  reaction" can occur to any of the medications used.      10. Worsening of the symptoms:  We can always make thing worse.    What are the chances of something like this happening? Chances of any of this occuring are extremely low.  By statistics, you have more of a chance of getting killed in a motor vehicle accident: while driving to the hospital than any of the above occurring .  Nevertheless, you should be aware that they are possibilities.  In general, it is similar to taking a shower.  Everybody knows that you can slip, hit your head and get killed.  Does that mean that you should not shower again?  Nevertheless always keep in mind that statistics do not mean anything if you happen to be on the wrong side of them.  Even if a procedure has a 1  (one) in a 1,000,000 (million) chance of going wrong, it you happen to be that one..Also, keep in mind that by statistics, you have more of a chance of having something go wrong when taking medications.  Who should not have this procedure? If you are on a blood thinning medication (e.g. Coumadin, Plavix, see list of "Blood Thinners"), or if you have an active infection going on, you should not have the procedure.  If you are taking any blood thinners, please inform your physician.  How should I prepare for this procedure?  Do not eat or drink anything at least six hours prior to the procedure.  Bring a driver with you .  It cannot be a taxi.  Come accompanied by an adult that can drive you back, and that is strong enough to help you if your legs get weak or numb from the local anesthetic.  Take all of your medicines the morning of the procedure with just enough water to swallow them.  If you have diabetes, make sure that you are scheduled to have your procedure done first thing in the morning, whenever possible.  If you have diabetes, take only half of your insulin dose and notify our nurse that you have done so as soon as you arrive at the clinic.  If you are diabetic, but only take blood sugar pills (oral hypoglycemic), then do not take them on the morning of your procedure.  You may take them after you have had the procedure.  Do not take aspirin or any aspirin-containing medications, at least eleven (11) days prior to the procedure.  They may prolong bleeding.  Wear loose fitting clothing that may be easy to take off and that you would not mind if it got stained with Betadine or blood.  Do not wear any jewelry or perfume  Remove any nail coloring.  It will interfere with some of our monitoring equipment.  NOTE: Remember that this is not meant to be interpreted as a complete list of all possible complications.  Unforeseen problems may occur.  BLOOD THINNERS The following drugs  contain aspirin or other products, which can cause increased bleeding during surgery and should not be taken for 2 weeks prior to and 1 week after surgery.  If you should need take something for relief of minor pain, you may take acetaminophen which is found in Tylenol,m Datril, Anacin-3 and Panadol. It is not blood thinner. The products listed below are.  Do not take any of the products listed below in addition to any listed on your instruction sheet.  A.P.C or A.P.C with Codeine Codeine Phosphate Capsules #3 Ibuprofen Ridaura  ABC compound Congesprin Imuran rimadil  Advil Cope Indocin Robaxisal  Alka-Seltzer Effervescent Pain Reliever and Antacid Coricidin or Coricidin-D  Indomethacin Rufen    Alka-Seltzer plus Cold Medicine Cosprin Ketoprofen S-A-C Tablets  Anacin Analgesic Tablets or Capsules Coumadin Korlgesic Salflex  Anacin Extra Strength Analgesic tablets or capsules CP-2 Tablets Lanoril Salicylate  Anaprox Cuprimine Capsules Levenox Salocol  Anexsia-D Dalteparin Magan Salsalate  Anodynos Darvon compound Magnesium Salicylate Sine-off  Ansaid Dasin Capsules Magsal Sodium Salicylate  Anturane Depen Capsules Marnal Soma  APF Arthritis pain formula Dewitt's Pills Measurin Stanback  Argesic Dia-Gesic Meclofenamic Sulfinpyrazone  Arthritis Bayer Timed Release Aspirin Diclofenac Meclomen Sulindac  Arthritis pain formula Anacin Dicumarol Medipren Supac  Analgesic (Safety coated) Arthralgen Diffunasal Mefanamic Suprofen  Arthritis Strength Bufferin Dihydrocodeine Mepro Compound Suprol  Arthropan liquid Dopirydamole Methcarbomol with Aspirin Synalgos  ASA tablets/Enseals Disalcid Micrainin Tagament  Ascriptin Doan's Midol Talwin  Ascriptin A/D Dolene Mobidin Tanderil  Ascriptin Extra Strength Dolobid Moblgesic Ticlid  Ascriptin with Codeine Doloprin or Doloprin with Codeine Momentum Tolectin  Asperbuf Duoprin Mono-gesic Trendar  Aspergum Duradyne Motrin or Motrin IB Triminicin  Aspirin  plain, buffered or enteric coated Durasal Myochrisine Trigesic  Aspirin Suppositories Easprin Nalfon Trillsate  Aspirin with Codeine Ecotrin Regular or Extra Strength Naprosyn Uracel  Atromid-S Efficin Naproxen Ursinus  Auranofin Capsules Elmiron Neocylate Vanquish  Axotal Emagrin Norgesic Verin  Azathioprine Empirin or Empirin with Codeine Normiflo Vitamin E  Azolid Emprazil Nuprin Voltaren  Bayer Aspirin plain, buffered or children's or timed BC Tablets or powders Encaprin Orgaran Warfarin Sodium  Buff-a-Comp Enoxaparin Orudis Zorpin  Buff-a-Comp with Codeine Equegesic Os-Cal-Gesic   Buffaprin Excedrin plain, buffered or Extra Strength Oxalid   Bufferin Arthritis Strength Feldene Oxphenbutazone   Bufferin plain or Extra Strength Feldene Capsules Oxycodone with Aspirin   Bufferin with Codeine Fenoprofen Fenoprofen Pabalate or Pabalate-SF   Buffets II Flogesic Panagesic   Buffinol plain or Extra Strength Florinal or Florinal with Codeine Panwarfarin   Buf-Tabs Flurbiprofen Penicillamine   Butalbital Compound Four-way cold tablets Penicillin   Butazolidin Fragmin Pepto-Bismol   Carbenicillin Geminisyn Percodan   Carna Arthritis Reliever Geopen Persantine   Carprofen Gold's salt Persistin   Chloramphenicol Goody's Phenylbutazone   Chloromycetin Haltrain Piroxlcam   Clmetidine heparin Plaquenil   Cllnoril Hyco-pap Ponstel   Clofibrate Hydroxy chloroquine Propoxyphen         Before stopping any of these medications, be sure to consult the physician who ordered them.  Some, such as Coumadin (Warfarin) are ordered to prevent or treat serious conditions such as "deep thrombosis", "pumonary embolisms", and other heart problems.  The amount of time that you may need off of the medication may also vary with the medication and the reason for which you were taking it.  If you are taking any of these medications, please make sure you notify your pain physician before you undergo any  procedures.         Pain Management Discharge Instructions  General Discharge Instructions :  If you need to reach your doctor call: Monday-Friday 8:00 am - 4:00 pm at [phone] or toll free [phone].  After clinic hours [phone] to have operator reach doctor.  Bring all of your medication bottles to all your appointments in the pain clinic.  To cancel or reschedule your appointment with Pain Management please remember to call 24 hours in advance to avoid a fee.  Refer to the educational materials which you have been given on: General Risks, I had my Procedure. Discharge Instructions, Post Sedation.  Post Procedure Instructions:  The drugs you were given will stay in your system until tomorrow, so for the next 24 hours you should   not drive, make any legal decisions or drink any alcoholic beverages.  You may eat anything you prefer, but it is better to start with liquids then soups and crackers, and gradually work up to solid foods.  Please notify your doctor immediately if you have any unusual bleeding, trouble breathing or pain that is not related to your normal pain.  Depending on the type of procedure that was done, some parts of your body may feel week and/or numb.  This usually clears up by tonight or the next day.  Walk with the use of an assistive device or accompanied by an adult for the 24 hours.  You may use ice on the affected area for the first 24 hours.  Put ice in a Ziploc bag and cover with a towel and place against area 15 minutes on 15 minutes off.  You may switch to heat after 24 hours.Facet Blocks Patient Information  Description: The facets are joints in the spine between the vertebrae.  Like any joints in the body, facets can become irritated and painful.  Arthritis can also effect the facets.  By injecting steroids and local anesthetic in and around these joints, we can temporarily block the nerve supply to them.  Steroids act directly on  irritated nerves and tissues to reduce selling and inflammation which often leads to decreased pain.  Facet blocks may be done anywhere along the spine from the neck to the low back depending upon the location of your pain.   After numbing the skin with local anesthetic (like Novocaine), a small needle is passed onto the facet joints under x-ray guidance.  You may experience a sensation of pressure while this is being done.  The entire block usually lasts about 15-25 minutes.   Conditions which may be treated by facet blocks:   Low back/buttock pain  Neck/shoulder pain  Certain types of headaches  Preparation for the injection:  1. Do not eat any solid food or dairy products within 8 hours of your appointment. 2. You may drink clear liquid up to 3 hours before appointment.  Clear liquids include water, black coffee, juice or soda.  No milk or cream please. 3. You may take your regular medication, including pain medications, with a sip of water before your appointment.  Diabetics should hold regular insulin (if taken separately) and take 1/2 normal NPH dose the morning of the procedure.  Carry some sugar containing items with you to your appointment. 4. A driver must accompany you and be prepared to drive you home after your procedure. 5. Bring all your current medications with you. 6. An IV may be inserted and sedation may be given at the discretion of the physician. 7. A blood pressure cuff, EKG and other monitors will often be applied during the procedure.  Some patients may need to have extra oxygen administered for a short period. 8. You will be asked to provide medical information, including your allergies and medications, prior to the procedure.  We must know immediately if you are taking blood thinners (like Coumadin/Warfarin) or if you are allergic to IV iodine contrast (dye).  We must know if you could possible be pregnant.  Possible side-effects:   Bleeding from needle  site  Infection (rare, may require surgery)  Nerve injury (rare)  Numbness & tingling (temporary)  Difficulty urinating (rare, temporary)  Spinal headache (a headache worse with upright posture)  Light-headedness (temporary)  Pain at injection site (serveral days)  Decreased blood pressure (rare, temporary)    Weakness in arm/leg (temporary)  Pressure sensation in back/neck (temporary)   Call if you experience:   Fever/chills associated with headache or increased back/neck pain  Headache worsened by an upright position  New onset, weakness or numbness of an extremity below the injection site  Hives or difficulty breathing (go to the emergency room)  Inflammation or drainage at the injection site(s)  Severe back/neck pain greater than usual  New symptoms which are concerning to you  Please note:  Although the local anesthetic injected can often make your back or neck feel good for several hours after the injection, the pain will likely return. It takes 3-7 days for steroids to work.  You may not notice any pain relief for at least one week.  If effective, we will often do a series of 2-3 injections spaced 3-6 weeks apart to maximally decrease your pain.  After the initial series, you may be a candidate for a more permanent nerve block of the facets.  If you have any questions, please call [phone] [facility] 

## 2019-09-18 LAB — TOXASSURE SELECT 13 (MW), URINE

## 2019-10-21 ENCOUNTER — Other Ambulatory Visit: Payer: Self-pay

## 2019-10-21 ENCOUNTER — Encounter: Payer: Self-pay | Admitting: Anesthesiology

## 2019-10-21 ENCOUNTER — Ambulatory Visit: Payer: Medicare HMO | Attending: Anesthesiology | Admitting: Anesthesiology

## 2019-10-21 DIAGNOSIS — M545 Low back pain, unspecified: Secondary | ICD-10-CM

## 2019-10-21 DIAGNOSIS — M5136 Other intervertebral disc degeneration, lumbar region: Secondary | ICD-10-CM | POA: Diagnosis not present

## 2019-10-21 DIAGNOSIS — G894 Chronic pain syndrome: Secondary | ICD-10-CM

## 2019-10-21 DIAGNOSIS — F1190 Opioid use, unspecified, uncomplicated: Secondary | ICD-10-CM

## 2019-10-21 DIAGNOSIS — M47816 Spondylosis without myelopathy or radiculopathy, lumbar region: Secondary | ICD-10-CM

## 2019-10-21 DIAGNOSIS — M5412 Radiculopathy, cervical region: Secondary | ICD-10-CM | POA: Diagnosis not present

## 2019-10-21 DIAGNOSIS — M542 Cervicalgia: Secondary | ICD-10-CM | POA: Diagnosis not present

## 2019-10-21 DIAGNOSIS — G8929 Other chronic pain: Secondary | ICD-10-CM

## 2019-10-21 MED ORDER — HYDROCODONE-ACETAMINOPHEN 10-325 MG PO TABS: 1.0000 | ORAL_TABLET | Freq: Four times a day (QID) | ORAL | 0 refills | Status: DC | PRN

## 2019-10-21 NOTE — Progress Notes (Signed)
Virtual Visit via Telephone Note  I connected with [name]. on 10/21/19 at  1:45 PM EDT by telephone and verified that I am speaking with the correct person using two identifiers.  Location: Patient: Home Provider: Pain control center   I discussed the limitations, risks, security and privacy concerns of performing an evaluation and management service by telephone and the availability of in person appointments. I also discussed with the patient that there may be a patient responsible charge related to this service. The patient expressed understanding and agreed to proceed.   History of Present Illness I spoke with Mr. Frei today via telephone as he was unable to do the video portion of the virtual conference.  He reports that he is done quite well following his facet block back in May.  He reports 75 to 80% ongoing relief in the right lower back.  He still has some centralized low back pain and longstanding persistent neck pain.  He takes his hydrocodone averaging 3 to 4 tablets/day for generalized pain syndrome and pain relief.  This continues to work well for him and based on his description he continues to derive good functional benefit from his medications and these enable him to continue exercising and staying active.  He is sleeping better at night he reports.  He is very pleased with his response to the facet injection.  He is try to do stretching strengthening exercises as tolerated.  Otherwise he is in his usual state of health this    Observations/Objective: Marland Kitchen Current Outpatient Medications:  .  buPROPion (WELLBUTRIN SR) 150 MG 12 hr tablet, Take by mouth., Disp: , Rfl:  .  BYSTOLIC 10 MG tablet, , Disp: , Rfl:  .  docusate sodium (COLACE) 100 MG capsule, Take by mouth., Disp: , Rfl:  .  fluticasone (FLONASE) 50 MCG/ACT nasal spray, , Disp: , Rfl:  .  hydrochlorothiazide (HYDRODIURIL) 25 MG tablet, Take 25 mg by mouth daily., Disp: , Rfl:  .  [START ON 11/25/2019]  HYDROcodone-acetaminophen (NORCO) 10-325 MG tablet, Take 1 tablet by mouth every 6 (six) hours as needed for moderate pain or severe pain., Disp: 120 tablet, Rfl: 0 .  [START ON 12/25/2019] HYDROcodone-acetaminophen (NORCO) 10-325 MG tablet, Take 1 tablet by mouth every 6 (six) hours as needed for moderate pain or severe pain., Disp: 120 tablet, Rfl: 0 .  lisinopril (PRINIVIL,ZESTRIL) 20 MG tablet, Take 20 mg by mouth daily., Disp: , Rfl:  .  lovastatin (MEVACOR) 20 MG tablet, , Disp: , Rfl:  .  meloxicam (MOBIC) 15 MG tablet, Take by mouth., Disp: , Rfl:  .  NEXIUM 40 MG capsule, , Disp: , Rfl:  .  omeprazole (PRILOSEC) 40 MG capsule, Take 40 mg by mouth daily., Disp: , Rfl:  .  traMADol (ULTRAM) 50 MG tablet, Take by mouth., Disp: , Rfl:   Assessment and Plan: 1. Lumbar facet arthropathy   2. Spondylosis of lumbar region without myelopathy or radiculopathy   3. Degenerative disc disease, lumbar   4. Chronic, continuous use of opioids   5. Chronic pain syndrome   6. Chronic bilateral low back pain without sciatica   7. Cervicalgia   8. Cervical radiculitis   Based on our discussion today I am going to refill his pain medications after review of the [facility] practitioner database information.  This will be dated for July 13 and August 12.  Going to request a repeat facet block but I will defer to his discretion  as to when this will be.  If he continues to have long-term response we will defer on this.  We have also talked about options for possible radiofrequency ablation if indicated.  I want him to continue with the stretching strengthening exercises and continue with his current medication management for his diffuse body pain.  We will schedule him for return to clinic in 2 months unless he contacts [place] earlier for a repeat facet injection.  He is also to continue with his primary care physicians.  Follow Up Instructions:    I discussed the assessment and treatment plan with the  patient. The patient was provided an opportunity to ask questions and all were answered. The patient agreed with the plan and demonstrated an understanding of the instructions.   The patient was advised to call back or seek an in-person evaluation if the symptoms worsen or if the condition fails to improve as anticipated.  I provided 25 minutes of non-face-to-face time during this encounter.   Molli Barrows, MD

## 2019-10-22 ENCOUNTER — Ambulatory Visit: Payer: Medicare HMO | Admitting: Anesthesiology

## 2019-11-21 DIAGNOSIS — E785 Hyperlipidemia, unspecified: Secondary | ICD-10-CM | POA: Diagnosis not present

## 2019-11-21 DIAGNOSIS — J440 Chronic obstructive pulmonary disease with acute lower respiratory infection: Secondary | ICD-10-CM | POA: Diagnosis not present

## 2019-11-21 DIAGNOSIS — Z0000 Encounter for general adult medical examination without abnormal findings: Secondary | ICD-10-CM | POA: Diagnosis not present

## 2019-11-21 DIAGNOSIS — Z125 Encounter for screening for malignant neoplasm of prostate: Secondary | ICD-10-CM | POA: Diagnosis not present

## 2019-11-21 DIAGNOSIS — F17210 Nicotine dependence, cigarettes, uncomplicated: Secondary | ICD-10-CM | POA: Diagnosis not present

## 2019-11-21 DIAGNOSIS — Z79899 Other long term (current) drug therapy: Secondary | ICD-10-CM | POA: Diagnosis not present

## 2019-11-21 DIAGNOSIS — I10 Essential (primary) hypertension: Secondary | ICD-10-CM | POA: Diagnosis not present

## 2019-11-23 IMAGING — US US EXTREM  UP VENOUS*L*
1 series · 13 of 24 positions shown · non-contrast
Comparison: None.

CLINICAL DATA: Left arm swelling and pain



[Series 1: us extrem up venous*left* · 0.08mm/px · 13 of 42 slices shown]
[im 1/42]
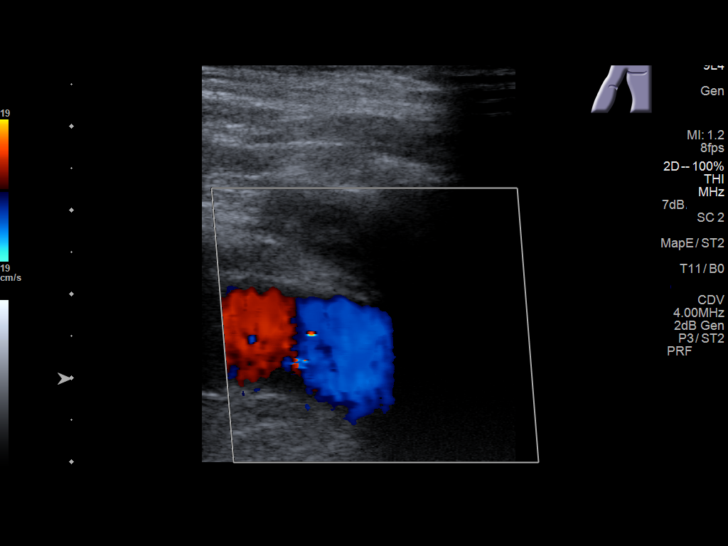
[im 4/42]
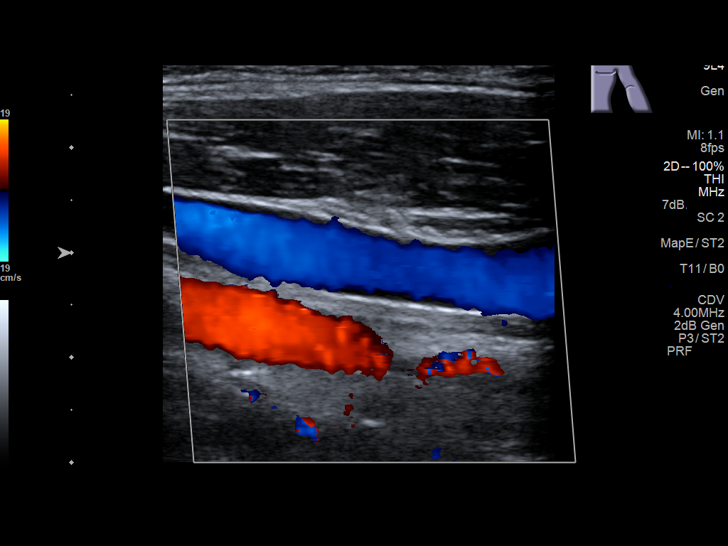
[im 8/42]
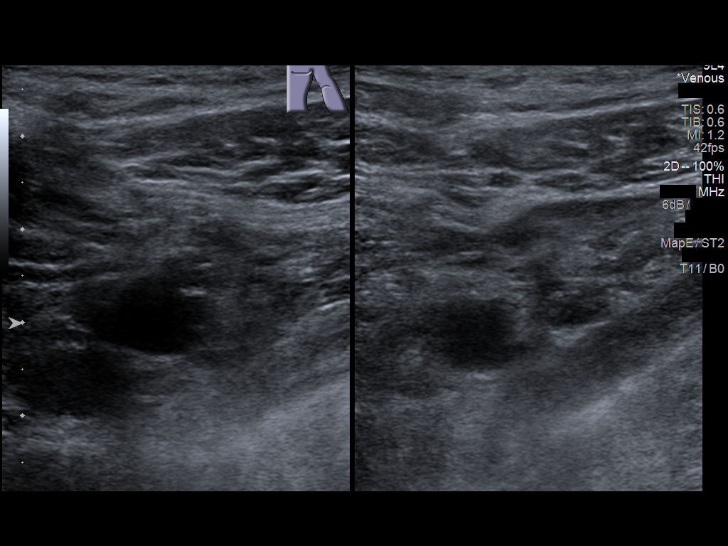
[im 11/42]
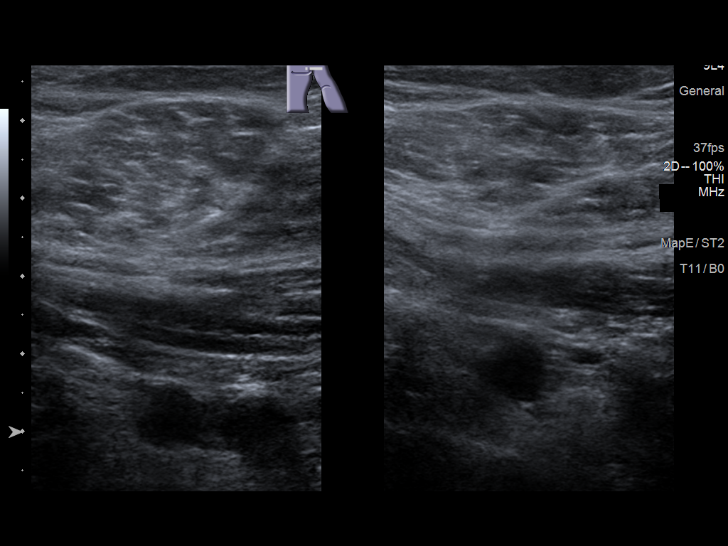
[im 15/42]
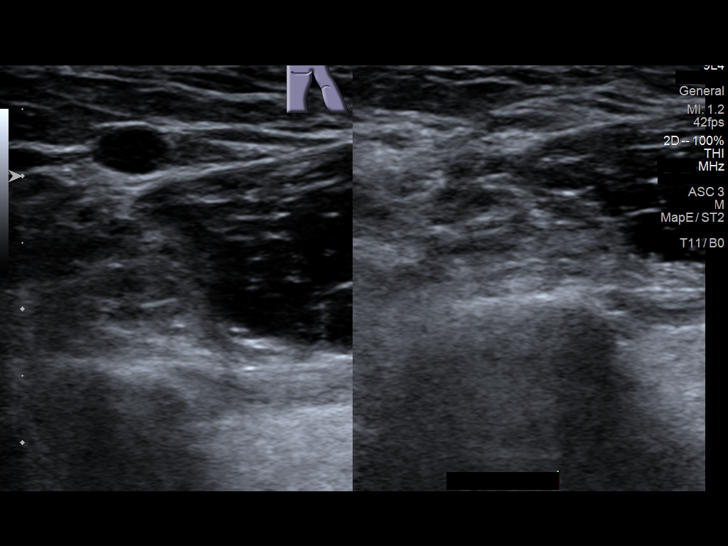
[im 18/42]
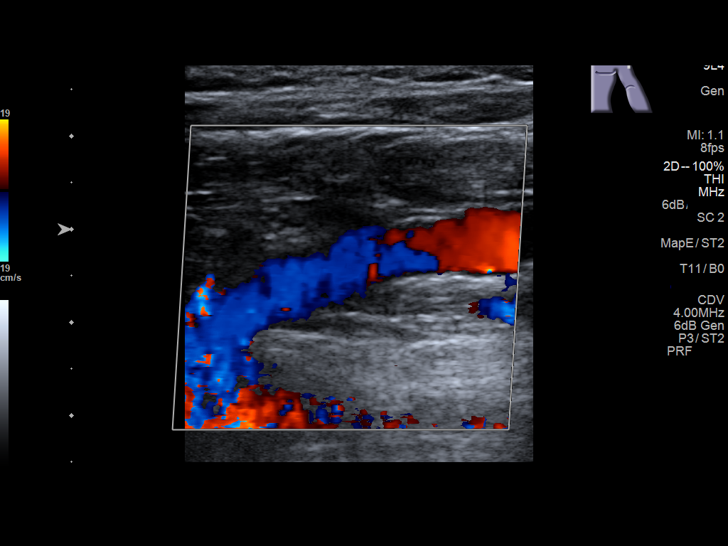
[im 22/42]
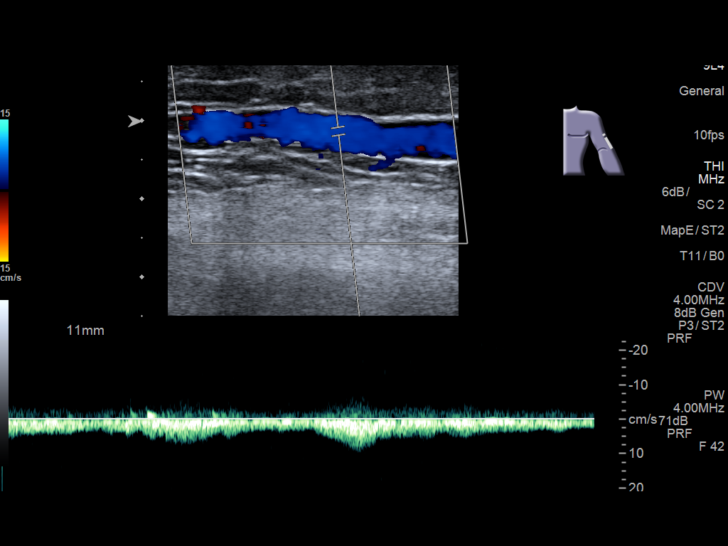
[im 24/42]
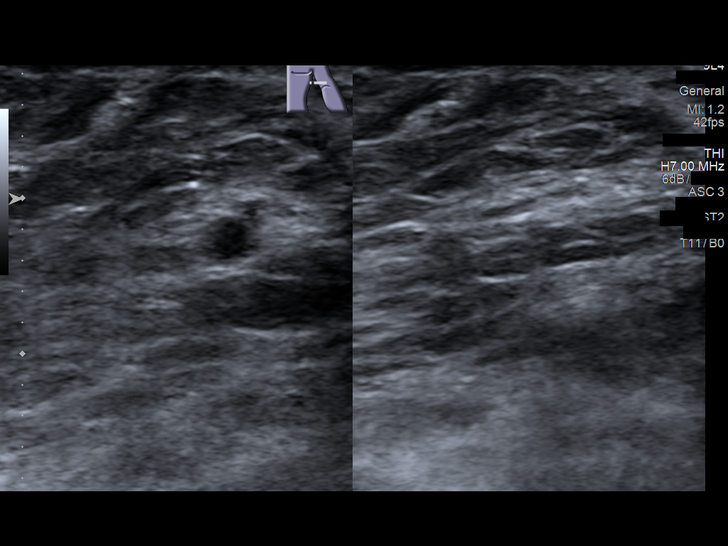
[im 27/42]
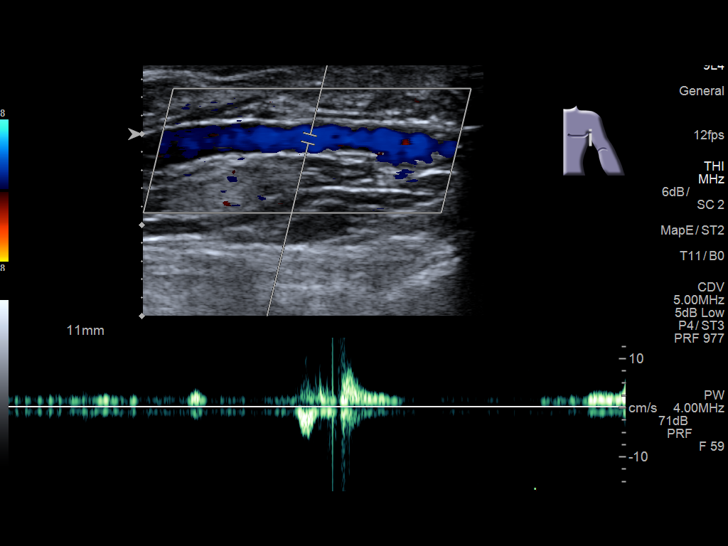
[im 31/42]
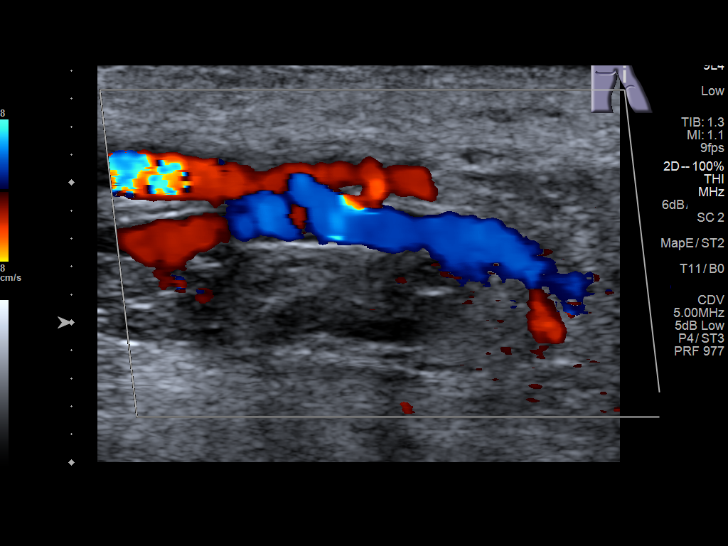
[im 34/42]
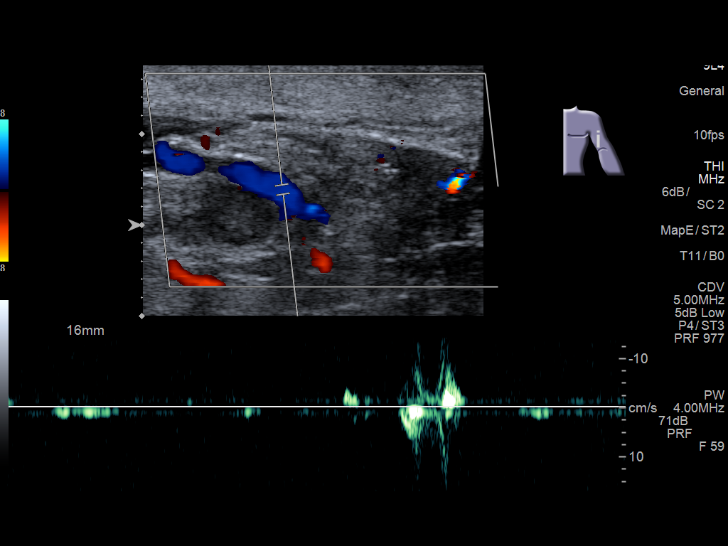
[im 38/42]
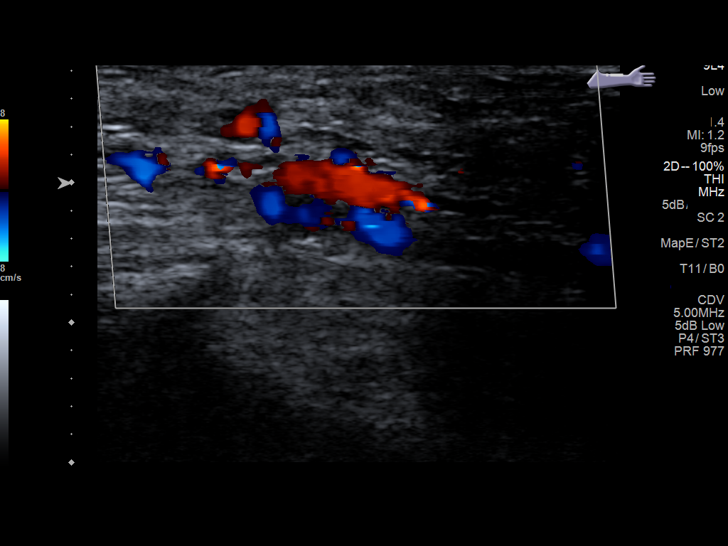
[im 42/42]
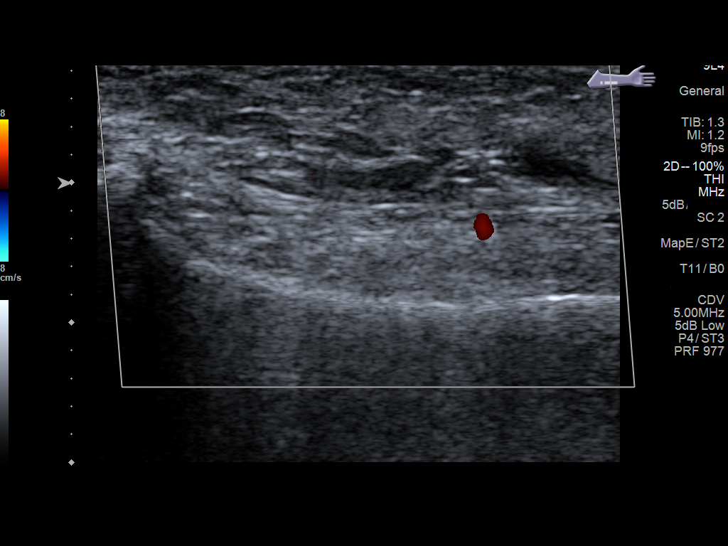

[13 of 24 positions shown; findings below may reference images not displayed]

FINDINGS: Contralateral Subclavian Vein: Respiratory phasicity is normal and
symmetric with the symptomatic side. No evidence of thrombus. Normal
compressibility.

Internal Jugular Vein: No evidence of thrombus. Normal
compressibility, respiratory phasicity and response to augmentation.

Subclavian Vein: No evidence of thrombus. Normal compressibility,
respiratory phasicity and response to augmentation.

Axillary Vein: No evidence of thrombus. Normal compressibility,
respiratory phasicity and response to augmentation.

Cephalic Vein: No evidence of thrombus. Normal compressibility,
respiratory phasicity and response to augmentation.

Basilic Vein: No evidence of thrombus. Normal compressibility,
respiratory phasicity and response to augmentation.

Brachial Veins: No evidence of thrombus. Normal compressibility,
respiratory phasicity and response to augmentation.

Radial Veins: No evidence of thrombus. Normal compressibility,
respiratory phasicity and response to augmentation.

Ulnar Veins: Not visualized

Venous Reflux:  Not assessed

Other Findings:  None visualized.
IMPRESSION: No evidence of significant DVT within the left upper extremity.

## 2019-12-05 DIAGNOSIS — Z79899 Other long term (current) drug therapy: Secondary | ICD-10-CM | POA: Diagnosis not present

## 2019-12-05 DIAGNOSIS — E7800 Pure hypercholesterolemia, unspecified: Secondary | ICD-10-CM | POA: Diagnosis not present

## 2019-12-05 DIAGNOSIS — I10 Essential (primary) hypertension: Secondary | ICD-10-CM | POA: Diagnosis not present

## 2019-12-05 DIAGNOSIS — Z125 Encounter for screening for malignant neoplasm of prostate: Secondary | ICD-10-CM | POA: Diagnosis not present

## 2019-12-09 ENCOUNTER — Other Ambulatory Visit: Payer: Self-pay | Admitting: Anesthesiology

## 2019-12-09 ENCOUNTER — Encounter: Payer: Self-pay | Admitting: Anesthesiology

## 2019-12-09 ENCOUNTER — Other Ambulatory Visit: Payer: Self-pay

## 2019-12-09 ENCOUNTER — Ambulatory Visit (HOSPITAL_BASED_OUTPATIENT_CLINIC_OR_DEPARTMENT_OTHER): Payer: Medicare HMO | Admitting: Anesthesiology

## 2019-12-09 ENCOUNTER — Ambulatory Visit
Admission: RE | Admit: 2019-12-09 | Discharge: 2019-12-09 | Disposition: A | Discharge status: Home / Self Care | Payer: Medicare HMO | Source: Ambulatory Visit | Attending: Anesthesiology | Admitting: Anesthesiology

## 2019-12-09 VITALS — BP 94/63 | HR 77 | Temp 97.4°F | Resp 16 | Ht 75.0 in | Wt 257.0 lb

## 2019-12-09 DIAGNOSIS — Z79899 Other long term (current) drug therapy: Secondary | ICD-10-CM | POA: Diagnosis not present

## 2019-12-09 DIAGNOSIS — G894 Chronic pain syndrome: Secondary | ICD-10-CM

## 2019-12-09 DIAGNOSIS — Z791 Long term (current) use of non-steroidal anti-inflammatories (NSAID): Secondary | ICD-10-CM | POA: Insufficient documentation

## 2019-12-09 DIAGNOSIS — F1190 Opioid use, unspecified, uncomplicated: Secondary | ICD-10-CM

## 2019-12-09 DIAGNOSIS — R52 Pain, unspecified: Secondary | ICD-10-CM

## 2019-12-09 DIAGNOSIS — M5136 Other intervertebral disc degeneration, lumbar region: Secondary | ICD-10-CM

## 2019-12-09 DIAGNOSIS — Z79891 Long term (current) use of opiate analgesic: Secondary | ICD-10-CM | POA: Diagnosis not present

## 2019-12-09 DIAGNOSIS — M47816 Spondylosis without myelopathy or radiculopathy, lumbar region: Secondary | ICD-10-CM | POA: Diagnosis not present

## 2019-12-09 MED ORDER — FENTANYL CITRATE (PF) 100 MCG/2ML IJ SOLN: 100.0000 ug | Freq: Once | INTRAMUSCULAR | Status: DC

## 2019-12-09 MED ORDER — LACTATED RINGERS IV SOLN: 1000.0000 mL | INTRAVENOUS | Status: DC

## 2019-12-09 MED ORDER — HYDROCODONE-ACETAMINOPHEN 10-325 MG PO TABS: 1.0000 | ORAL_TABLET | Freq: Four times a day (QID) | ORAL | 0 refills | Status: DC | PRN

## 2019-12-09 MED ORDER — LIDOCAINE HCL (PF) 1 % IJ SOLN
5.0000 mL | Freq: Once | INTRAMUSCULAR | Status: AC
  Administered 2019-12-09: 5 mL via SUBCUTANEOUS
  Filled 2019-12-09: qty 5

## 2019-12-09 MED ORDER — ROPIVACAINE HCL 2 MG/ML IJ SOLN
10.0000 mL | Freq: Once | INTRAMUSCULAR | Status: AC
  Administered 2019-12-09: 10 mL via EPIDURAL
  Filled 2019-12-09: qty 10

## 2019-12-09 MED ORDER — MIDAZOLAM HCL 2 MG/2ML IJ SOLN
5.0000 mg | Freq: Once | INTRAMUSCULAR | Status: AC
  Administered 2019-12-09: 4 mg via INTRAVENOUS
  Filled 2019-12-09: qty 5

## 2019-12-09 MED ORDER — MIDAZOLAM HCL 5 MG/5ML IJ SOLN
INTRAMUSCULAR | Status: AC
  Filled 2019-12-09: qty 5

## 2019-12-09 MED ORDER — TRIAMCINOLONE ACETONIDE 40 MG/ML IJ SUSP
40.0000 mg | Freq: Once | INTRAMUSCULAR | Status: AC
  Administered 2019-12-09: 40 mg
  Filled 2019-12-09: qty 1

## 2019-12-09 MED ORDER — SODIUM CHLORIDE 0.9% FLUSH: 10.0000 mL | Freq: Once | INTRAVENOUS | Status: DC

## 2019-12-10 ENCOUNTER — Telehealth: Payer: Self-pay

## 2019-12-10 NOTE — Telephone Encounter (Signed)
Post procedure phone call. Patient states he is doing well.  

## 2019-12-11 ENCOUNTER — Encounter: Payer: Self-pay | Admitting: Anesthesiology

## 2019-12-11 NOTE — Progress Notes (Signed)
Subjective:  Patient ID: Ryan Price., male    DOB: [birth year]  Age: 59 y.o. MRN: [id number]  CC: Back Pain   Procedure: Right side medial branch block to the lumbar facets at L2-3 L3-4 L4-5 L5-S1 with moderate sedation  HPI Cletis Clack. presents for reevaluation.  He was last seen a few months ago and continues to have some improvement in his right lower back pain.  He initially had 75 to 90% relief he reports.  He now has approximately 40 to 50% improvement overall but still is having some spasming in the low back on the right side with radiation of the right hip buttock and posterior leg on that side.  He reports no numbness and tingling affecting the right lower extremity or left side.  His bowel bladder function has been stable and otherwise the quality characteristic and distribution of his low back pain have been stable in nature.  He still taking his medications as prescribed and these continue to work well for him.  He denies any diverting or illicit use and based on his narcotic assessment sheet he continues to derive good functional benefit with his medication management.  He maintains that without the opioid medications he cannot function during the day.  Outpatient Medications Prior to Visit  Medication Sig Dispense Refill  . BYSTOLIC 10 MG tablet     . fluticasone (FLONASE) 50 MCG/ACT nasal spray     . hydrochlorothiazide (HYDRODIURIL) 25 MG tablet Take 25 mg by mouth daily.    Derrill Memo ON 12/25/2019] HYDROcodone-acetaminophen (NORCO) 10-325 MG tablet Take 1 tablet by mouth every 6 (six) hours as needed for moderate pain or severe pain. 120 tablet 0  . lisinopril (PRINIVIL,ZESTRIL) 20 MG tablet Take 20 mg by mouth daily.    Marland Kitchen lovastatin (MEVACOR) 20 MG tablet     . meloxicam (MOBIC) 15 MG tablet Take by mouth.    Marland Kitchen NEXIUM 40 MG capsule     . omeprazole (PRILOSEC) 40 MG capsule Take 40 mg by mouth daily.    Marland Kitchen buPROPion (WELLBUTRIN SR) 150 MG 12 hr tablet Take by  mouth.    . docusate sodium (COLACE) 100 MG capsule Take by mouth. (Patient not taking: Reported on 12/09/2019)    . traMADol (ULTRAM) 50 MG tablet Take by mouth. (Patient not taking: Reported on 12/09/2019)    . HYDROcodone-acetaminophen (NORCO) 10-325 MG tablet Take 1 tablet by mouth every 6 (six) hours as needed for moderate pain or severe pain. 120 tablet 0   No facility-administered medications prior to visit.    Review of Systems CNS: No confusion or sedation Cardiac: No angina or palpitations GI: No abdominal pain or constipation Constitutional: No nausea vomiting fevers or chills  Objective:  BP (!) 94/63   Pulse 77   Temp (!) 97.4 F (36.3 C)   Resp 16   Ht 6\' 3"  (1.905 m)   Wt (!) 257 lb (116.6 kg)   SpO2 97%   BMI 32.12 kg/m    BP Readings from Last 3 Encounters:  12/09/19 (!) 94/63  09/16/19 110/80  06/03/19 130/78     Wt Readings from Last 3 Encounters:  12/09/19 (!) 257 lb (116.6 kg)  09/16/19 262 lb (118.8 kg)  06/03/19 261 lb (118.4 kg)     Physical Exam Pt is alert and oriented PERRL EOMI HEART IS RRR no murmur or rub LCTA no wheezing or rales MUSCULOSKELETAL reveals pain on extension with right lateral rotation  while in the standing position.  This is in the lumbar region and down the right posterior lateral leg and into the hip.  This is present to lesser extent with left lateral rotation.  His muscle tone and bulk is at baseline.  He is ambulating with an antalgic gait.  Labs  No results found for: HGBA1C Lab Results  Component Value Date   CREATININE 0.81 05/26/2015    -------------------------------------------------------------------------------------------------------------------- Lab Results  Component Value Date   WBC 13.6 (H) 05/26/2015   HGB 13.9 04/26/2016   HCT 48.0 05/26/2015   PLT 242 05/26/2015   GLUCOSE 117 (H) 05/26/2015   ALT 28 05/26/2015   AST 21 05/26/2015   NA 138 05/26/2015   K 4.1 05/26/2015   CL 112 (H)  05/26/2015   CREATININE 0.81 05/26/2015   BUN 13 05/26/2015   CO2 21 (L) 05/26/2015    --------------------------------------------------------------------------------------------------------------------- DG PAIN CLINIC C-ARM 1-60 MIN NO REPORT  Result Date: 12/09/2019 Fluoro was used, but no Radiologist interpretation will be provided. Please refer to "NOTES" tab for provider progress note.    Assessment & Plan:   Jade was seen today for back pain.  Diagnoses and all orders for this visit:  Chronic, continuous use of opioids  Lumbar facet arthropathy -     LUMBAR FACET(MEDIAL BRANCH NERVE BLOCK) MBNB -     LUMBAR FACET(MEDIAL BRANCH NERVE BLOCK) MBNB; Future  Spondylosis of lumbar region without myelopathy or radiculopathy -     LUMBAR FACET(MEDIAL BRANCH NERVE BLOCK) MBNB -     LUMBAR FACET(MEDIAL BRANCH NERVE BLOCK) MBNB; Future  Degenerative disc disease, lumbar -     LUMBAR FACET(MEDIAL BRANCH NERVE BLOCK) MBNB  Chronic pain syndrome  Other orders -     triamcinolone acetonide (KENALOG-40) injection 40 mg -     sodium chloride flush (NS) 0.9 % injection 10 mL -     ropivacaine (PF) 2 mg/mL (0.2%) (NAROPIN) injection 10 mL -     midazolam (VERSED) injection 5 mg -     lidocaine (PF) (XYLOCAINE) 1 % injection 5 mL -     lactated ringers infusion 1,000 mL -     fentaNYL (SUBLIMAZE) injection 100 mcg -     HYDROcodone-acetaminophen (NORCO) 10-325 MG tablet; Take 1 tablet by mouth every 6 (six) hours as needed for moderate pain or severe pain.        ----------------------------------------------------------------------------------------------------------------------  Problem List Items Addressed This Visit      Unprioritized   Chronic pain   Relevant Medications   fentaNYL (SUBLIMAZE) injection 100 mcg   HYDROcodone-acetaminophen (NORCO) 10-325 MG tablet (Start on 01/24/2020)   Chronic, continuous use of opioids - Primary   Degenerative disc disease, lumbar    Relevant Medications   fentaNYL (SUBLIMAZE) injection 100 mcg   HYDROcodone-acetaminophen (NORCO) 10-325 MG tablet (Start on 01/24/2020)    Other Visit Diagnoses    Lumbar facet arthropathy       Relevant Orders   LUMBAR FACET(MEDIAL BRANCH NERVE BLOCK) MBNB   Spondylosis of lumbar region without myelopathy or radiculopathy       Relevant Medications   triamcinolone acetonide (KENALOG-40) injection 40 mg (Completed)   fentaNYL (SUBLIMAZE) injection 100 mcg   HYDROcodone-acetaminophen (NORCO) 10-325 MG tablet (Start on 01/24/2020)   Other Relevant Orders   LUMBAR FACET(MEDIAL BRANCH NERVE BLOCK) MBNB        ----------------------------------------------------------------------------------------------------------------------  1. Lumbar facet arthropathy We will proceed with a repeat facet block today.  The risks and benefits  of been reviewed once again in detail and all questions are answered.  I want him to continue with core stretching strengthening exercises as previously reviewed and no other changes are made today in his pain control protocol.  Our plan is for return to clinic in 2 months - LUMBAR FACET(MEDIAL BRANCH NERVE BLOCK) MBNB - LUMBAR FACET(MEDIAL BRANCH NERVE BLOCK) MBNB; Future  2. Spondylosis of lumbar region without myelopathy or radiculopathy As above with continued efforts at weight loss core strengthening - LUMBAR FACET(MEDIAL BRANCH NERVE BLOCK) MBNB - LUMBAR FACET(MEDIAL BRANCH NERVE BLOCK) MBNB; Future  3. Degenerative disc disease, lumbar  - LUMBAR FACET(MEDIAL BRANCH NERVE BLOCK) MBNB  4. Chronic, continuous use of opioids I have reviewed the [facility] practitioner database information and it is appropriate for refills for him today.  He is to be done for August 12 and September 11.  5. Chronic pain syndrome As above and continue follow-up with his primary care physicians for his baseline medical  care.    ----------------------------------------------------------------------------------------------------------------------  I am having Ryan Price. maintain his lisinopril, buPROPion, docusate sodium, meloxicam, traMADol, Bystolic, NexIUM, hydrochlorothiazide, fluticasone, lovastatin, omeprazole, HYDROcodone-acetaminophen, and HYDROcodone-acetaminophen. We administered triamcinolone acetonide, ropivacaine (PF) 2 mg/mL (0.2%), midazolam, and lidocaine (PF).   Meds ordered this encounter  Medications  . triamcinolone acetonide (KENALOG-40) injection 40 mg  . sodium chloride flush (NS) 0.9 % injection 10 mL  . ropivacaine (PF) 2 mg/mL (0.2%) (NAROPIN) injection 10 mL  . midazolam (VERSED) injection 5 mg  . lidocaine (PF) (XYLOCAINE) 1 % injection 5 mL  . lactated ringers infusion 1,000 mL  . fentaNYL (SUBLIMAZE) injection 100 mcg  . HYDROcodone-acetaminophen (NORCO) 10-325 MG tablet    Sig: Take 1 tablet by mouth every 6 (six) hours as needed for moderate pain or severe pain.    Dispense:  120 tablet    Refill:  0   Patient's Medications  New Prescriptions   No medications on file  Previous Medications   BUPROPION (WELLBUTRIN SR) 150 MG 12 HR TABLET    Take by mouth.   BYSTOLIC 10 MG TABLET       DOCUSATE SODIUM (COLACE) 100 MG CAPSULE    Take by mouth.   FLUTICASONE (FLONASE) 50 MCG/ACT NASAL SPRAY       HYDROCHLOROTHIAZIDE (HYDRODIURIL) 25 MG TABLET    Take 25 mg by mouth daily.   HYDROCODONE-ACETAMINOPHEN (NORCO) 10-325 MG TABLET    Take 1 tablet by mouth every 6 (six) hours as needed for moderate pain or severe pain.   LISINOPRIL (PRINIVIL,ZESTRIL) 20 MG TABLET    Take 20 mg by mouth daily.   LOVASTATIN (MEVACOR) 20 MG TABLET       MELOXICAM (MOBIC) 15 MG TABLET    Take by mouth.   NEXIUM 40 MG CAPSULE       OMEPRAZOLE (PRILOSEC) 40 MG CAPSULE    Take 40 mg by mouth daily.   TRAMADOL (ULTRAM) 50 MG TABLET    Take by mouth.  Modified Medications   Modified  Medication Previous Medication   HYDROCODONE-ACETAMINOPHEN (NORCO) 10-325 MG TABLET HYDROcodone-acetaminophen (NORCO) 10-325 MG tablet      Take 1 tablet by mouth every 6 (six) hours as needed for moderate pain or severe pain.    Take 1 tablet by mouth every 6 (six) hours as needed for moderate pain or severe pain.  Discontinued Medications   No medications on file   ----------------------------------------------------------------------------------------------------------------------  Follow-up: Return in about 2 months (around 02/09/2020) for procedure,  med refill.  Procedure: Right side lumbar medial branch block under fluoroscopic guidance at L2-3 L3-4 L4-5 L5-S1 with Versed for sedation.  This was tolerated well. Patient was taken to the fluoroscopy suite and placed in the prone position.Vital signs were stable throughout the procedure. The  overlying area of skin on the right side was prepped with Betadine 3 and strict aseptic technique was utilized throughout the procedure. Flouroscopy was used to  identify the areas overlying the aforementioned facets at  L2-3 L3-4  L4-5 and  L5-S1. 1% lidocaine 1 cc was infiltrated subcutaneously and into the fascia with a 25-gauge needle at each of these sites. I then advanced a 22-gauge 3-1/2 inch Quinckie needle with the needle tip to lie at the "[facility]" portion of the [employer] dog". There was negative aspiration for heme or CSF and  no paresthesia. I then injected 2 cc of ropivacaine 0.2% mixed with 10 mg of triamcinolone at each of the aforementioned sites. These needles were withdrawn.  The patient tolerated the procedure without any difficulty and was convalesced discharged home in stable condition for follow-up as mentioned  Molli Barrows, MD

## 2019-12-12 DIAGNOSIS — M67431 Ganglion, right wrist: Secondary | ICD-10-CM | POA: Diagnosis not present

## 2019-12-12 DIAGNOSIS — M25531 Pain in right wrist: Secondary | ICD-10-CM | POA: Diagnosis not present

## 2019-12-15 ENCOUNTER — Other Ambulatory Visit: Payer: Self-pay | Admitting: Orthopedic Surgery

## 2019-12-18 ENCOUNTER — Encounter
Admission: RE | Admit: 2019-12-18 | Discharge: 2019-12-18 | Disposition: A | Discharge status: Home / Self Care | Payer: Medicare HMO | Source: Ambulatory Visit | Attending: Orthopedic Surgery | Admitting: Orthopedic Surgery

## 2019-12-18 ENCOUNTER — Other Ambulatory Visit: Payer: Self-pay

## 2019-12-18 DIAGNOSIS — I10 Essential (primary) hypertension: Secondary | ICD-10-CM | POA: Insufficient documentation

## 2019-12-18 DIAGNOSIS — Z01818 Encounter for other preprocedural examination: Secondary | ICD-10-CM | POA: Insufficient documentation

## 2019-12-18 HISTORY — DX: Dyspnea, unspecified: R06.00

## 2019-12-18 NOTE — Patient Instructions (Signed)
Your procedure is scheduled on: Thurs. 8/12 Report to Day Surgery. To find out your arrival time please call [phone] between [place] on Wed. 8/11.  Remember: Instructions that are not followed completely may result in serious medical risk,  up to and including death, or upon the discretion of your surgeon and anesthesiologist your  surgery may need to be rescheduled.     _X__ 1. Do not eat food after midnight the night before your procedure.                 No gum chewing or hard candies. You may drink clear liquids up to 2 hours                 before you are scheduled to arrive for your surgery- DO not drink clear                 liquids within 2 hours of the start of your surgery.                 Clear Liquids include:  water, apple juice without pulp, clear Gatorade, G2 or                  Gatorade Zero (avoid Red/Purple/Blue), Black Coffee or Tea (Do not add                 anything to coffee or tea). __x___2.   Complete the carbohydrate drink provided to you, 2 hours before arrival.  __X__2.  On the morning of surgery brush your teeth with toothpaste and water, you                may rinse your mouth with mouthwash if you wish.  Do not swallow any toothpaste of mouthwash.     ___ 3.  No Alcohol for 24 hours before or after surgery.   _X__ 4.  Do Not Smoke or use e-cigarettes For 24 Hours Prior to Your Surgery.                 Do not use any chewable tobacco products for at least 6 hours prior to                 Surgery.  ___  5.  Do not use any recreational drugs (marijuana, cocaine, heroin, ecstacy, MDMA or other)                For at least one week prior to your surgery.  Combination of these drugs with anesthesia                May have life threatening results.  ____  6.  Bring all medications with you on the day of surgery if instructed.   __x__  7.  Notify your doctor if there is any change in your medical condition      (cold, fever,  infections).     Do not wear jewelry,  Do not wear lotions,  Do not shave 48 hours prior to surgery. Men may shave face and neck. Do not bring valuables to the hospital.    [facility] is not responsible for any belongings or valuables.  Contacts, dentures or bridgework may not be worn into surgery. Leave your suitcase in the car. After surgery it may be brought to your room. For patients admitted to the hospital, discharge time is determined by your treatment team.   Patients discharged the day of surgery will not  be allowed to drive home.   Make arrangements for someone to be with you for the first 24 hours of your Same Day Discharge.    Please read over the following fact sheets that you were given:   Incentive spirometer  __x__ Take these medicines the morning of surgery with A SIP OF WATER:    1. BYSTOLIC 10 MG tablet  2. fluticasone (FLONASE) 50 MCG/ACT nasal spray  3. HYDROcodone-acetaminophen (NORCO) 10-325 MG tablet if needed  4.omeprazole (PRILOSEC) 40 MG capsule  Dose the night before and the morning of surgery  5.  6.  ____ Fleet Enema (as directed)   __x__ Use CHG Soap (or wipes) as directed  ____ Use Benzoyl Peroxide Gel as instructed  ____ Use inhalers on the day of surgery  ____ Stop metformin 2 days prior to surgery    ____ Take 1/2 of usual insulin dose the night before surgery. No insulin the morning          of surgery.   ____ Stop Coumadin/Plavix/aspirin on   __x__ Stop Anti-inflammatories  No ibuprofen aleve or aspirin.    May take tylenol   ____ Stop supplements until after surgery.    ____ Bring C-Pap to the hospital.

## 2019-12-19 ENCOUNTER — Ambulatory Visit: Payer: Medicare HMO | Attending: Anesthesiology | Admitting: Anesthesiology

## 2019-12-19 ENCOUNTER — Encounter: Payer: Self-pay | Admitting: Anesthesiology

## 2019-12-19 DIAGNOSIS — M542 Cervicalgia: Secondary | ICD-10-CM

## 2019-12-19 DIAGNOSIS — F1190 Opioid use, unspecified, uncomplicated: Secondary | ICD-10-CM | POA: Diagnosis not present

## 2019-12-19 DIAGNOSIS — M5136 Other intervertebral disc degeneration, lumbar region: Secondary | ICD-10-CM | POA: Diagnosis not present

## 2019-12-19 DIAGNOSIS — M5412 Radiculopathy, cervical region: Secondary | ICD-10-CM

## 2019-12-19 DIAGNOSIS — G8929 Other chronic pain: Secondary | ICD-10-CM

## 2019-12-19 DIAGNOSIS — G894 Chronic pain syndrome: Secondary | ICD-10-CM

## 2019-12-19 DIAGNOSIS — M47816 Spondylosis without myelopathy or radiculopathy, lumbar region: Secondary | ICD-10-CM

## 2019-12-19 DIAGNOSIS — M545 Low back pain: Secondary | ICD-10-CM

## 2019-12-19 NOTE — Progress Notes (Signed)
Virtual Visit via Telephone Note  I connected with Ryan Price. on 12/19/19 at  1:15 PM EDT by telephone and verified that I am speaking with the correct person using two identifiers.  Location: Patient: Home Provider: Pain control center   I discussed the limitations, risks, security and privacy concerns of performing an evaluation and management service by telephone and the availability of in person appointments. I also discussed with the patient that there may be a patient responsible charge related to this service. The patient expressed understanding and agreed to proceed.   History of Present Illness: I spoke to Ryan Price today via telephone as he was unable to do the video portion of the virtual conference he reports that he is done well with his recent right side facet block.  He had 2 to 3 days with some increased spasming however that has subsided and he is having good relief now.  He rates at approximately 75% improvement overall.  He is continue to do his core stretching strengthening exercises as tolerated he is also taking his opioid medications as prescribed and these continue to work well for him.  He has been using 4 times a day dosing and continues to derive good functional benefit with the medication no side effects reported.  The combination of facet blocks and opioid medications has given him more relief and allowed him to stay active and be more functional and sleep better at night.  The quality characteristic and distribution of the pain in the low back is diminished but otherwise unchanged.  Bowel bladder function remains good and strength is been good    Observations/Objective:  Current Outpatient Medications:  .  BYSTOLIC 10 MG tablet, Take 10 mg by mouth daily. , Disp: , Rfl:  .  fluticasone (FLONASE) 50 MCG/ACT nasal spray, Place 1-2 sprays into both nostrils daily as needed (sinus issues/runny nose). , Disp: , Rfl:  .  [START ON 01/24/2020]  HYDROcodone-acetaminophen (NORCO) 10-325 MG tablet, Take 1 tablet by mouth every 6 (six) hours as needed for moderate pain or severe pain., Disp: 120 tablet, Rfl: 0 .  lisinopril (PRINIVIL,ZESTRIL) 20 MG tablet, Take 20 mg by mouth daily., Disp: , Rfl:  .  lovastatin (MEVACOR) 20 MG tablet, Take 20 mg by mouth daily. , Disp: , Rfl:  .  omeprazole (PRILOSEC) 40 MG capsule, Take 40 mg by mouth daily before breakfast. , Disp: , Rfl:  .  SYMBICORT 160-4.5 MCG/ACT inhaler, Inhale 1 puff into the lungs at bedtime. , Disp: , Rfl:   Assessment and Plan: 1. Lumbar facet arthropathy   2. Spondylosis of lumbar region without myelopathy or radiculopathy   3. Degenerative disc disease, lumbar   4. Chronic, continuous use of opioids   5. Chronic pain syndrome   6. Chronic bilateral low back pain without sciatica   7. Cervicalgia   8. Cervical radiculitis   9. DDD (degenerative disc disease), lumbar   Based on our discussion today and upon review of the  [facility] practitioner database information we will keep him on his current regimen.  I have reviewed his urine drug screen and it is appropriate.  I encouraged him to continue with core stretching strengthening exercises and aerobic conditioning as tolerated.  We will schedule him for return to clinic in approximately 2 months and a possible right side facet block at that time.  If he should have any questions he is instructed to contact us of the pain control center and to  continue follow-up with his primary care physicians for his baseline medical care.  Follow Up Instructions:    I discussed the assessment and treatment plan with the patient. The patient was provided an opportunity to ask questions and all were answered. The patient agreed with the plan and demonstrated an understanding of the instructions.   The patient was advised to call back or seek an in-person evaluation if the symptoms worsen or if the condition fails to improve as  anticipated.  I provided 25 minutes of non-face-to-face time during this encounter.   Molli Barrows, MD

## 2019-12-22 ENCOUNTER — Other Ambulatory Visit: Payer: Self-pay

## 2019-12-22 ENCOUNTER — Encounter
Admission: RE | Admit: 2019-12-22 | Discharge: 2019-12-22 | Disposition: A | Discharge status: Home / Self Care | Payer: Medicare HMO | Source: Ambulatory Visit | Attending: Orthopedic Surgery | Admitting: Orthopedic Surgery

## 2019-12-22 DIAGNOSIS — Z01818 Encounter for other preprocedural examination: Secondary | ICD-10-CM | POA: Insufficient documentation

## 2019-12-22 DIAGNOSIS — Z01810 Encounter for preprocedural cardiovascular examination: Secondary | ICD-10-CM

## 2019-12-22 DIAGNOSIS — Z20822 Contact with and (suspected) exposure to covid-19: Secondary | ICD-10-CM | POA: Diagnosis not present

## 2019-12-22 LAB — SARS CORONAVIRUS 2 (TAT 6-24 HRS): SARS Coronavirus 2: NEGATIVE

## 2019-12-23 ENCOUNTER — Other Ambulatory Visit: Payer: Medicare HMO

## 2019-12-24 MED ORDER — CEFAZOLIN SODIUM-DEXTROSE 2-4 GM/100ML-% IV SOLN
2.0000 g | INTRAVENOUS | Status: AC
  Administered 2019-12-25: 2 g via INTRAVENOUS

## 2019-12-25 ENCOUNTER — Other Ambulatory Visit: Payer: Self-pay

## 2019-12-25 ENCOUNTER — Encounter: Payer: Self-pay | Admitting: Orthopedic Surgery

## 2019-12-25 ENCOUNTER — Ambulatory Visit
Admission: RE | Admit: 2019-12-25 | Discharge: 2019-12-25 | Disposition: A | Discharge status: Home / Self Care | Payer: Medicare HMO | Attending: Orthopedic Surgery | Admitting: Orthopedic Surgery

## 2019-12-25 ENCOUNTER — Encounter
Admission: RE | Disposition: A | Discharge status: Home / Self Care | Payer: Self-pay | Source: Home / Self Care | Attending: Orthopedic Surgery

## 2019-12-25 ENCOUNTER — Ambulatory Visit: Payer: Medicare HMO | Admitting: Anesthesiology

## 2019-12-25 DIAGNOSIS — E785 Hyperlipidemia, unspecified: Secondary | ICD-10-CM | POA: Insufficient documentation

## 2019-12-25 DIAGNOSIS — Z791 Long term (current) use of non-steroidal anti-inflammatories (NSAID): Secondary | ICD-10-CM | POA: Insufficient documentation

## 2019-12-25 DIAGNOSIS — J449 Chronic obstructive pulmonary disease, unspecified: Secondary | ICD-10-CM | POA: Diagnosis not present

## 2019-12-25 DIAGNOSIS — I10 Essential (primary) hypertension: Secondary | ICD-10-CM | POA: Diagnosis not present

## 2019-12-25 DIAGNOSIS — K219 Gastro-esophageal reflux disease without esophagitis: Secondary | ICD-10-CM | POA: Insufficient documentation

## 2019-12-25 DIAGNOSIS — Z7951 Long term (current) use of inhaled steroids: Secondary | ICD-10-CM | POA: Diagnosis not present

## 2019-12-25 DIAGNOSIS — Z79899 Other long term (current) drug therapy: Secondary | ICD-10-CM | POA: Diagnosis not present

## 2019-12-25 DIAGNOSIS — M67431 Ganglion, right wrist: Secondary | ICD-10-CM | POA: Insufficient documentation

## 2019-12-25 DIAGNOSIS — Z87891 Personal history of nicotine dependence: Secondary | ICD-10-CM | POA: Insufficient documentation

## 2019-12-25 DIAGNOSIS — M25531 Pain in right wrist: Secondary | ICD-10-CM | POA: Diagnosis not present

## 2019-12-25 HISTORY — PX: GANGLION CYST EXCISION: SHX1691

## 2019-12-25 SURGERY — EXCISION, GANGLION CYST, WRIST
Anesthesia: General | Site: Wrist | Laterality: Right

## 2019-12-25 MED ORDER — FENTANYL CITRATE (PF) 100 MCG/2ML IJ SOLN: 25.0000 ug | INTRAMUSCULAR | Status: DC | PRN

## 2019-12-25 MED ORDER — CEFAZOLIN SODIUM-DEXTROSE 2-4 GM/100ML-% IV SOLN
INTRAVENOUS | Status: AC
  Filled 2019-12-25: qty 100

## 2019-12-25 MED ORDER — CHLORHEXIDINE GLUCONATE 0.12 % MT SOLN: 15.0000 mL | Freq: Once | OROMUCOSAL | Status: AC

## 2019-12-25 MED ORDER — OXYCODONE HCL 5 MG PO TABS: 5.0000 mg | ORAL_TABLET | Freq: Three times a day (TID) | ORAL | 0 refills | Status: AC | PRN

## 2019-12-25 MED ORDER — ONDANSETRON HCL 4 MG/2ML IJ SOLN: 4.0000 mg | Freq: Once | INTRAMUSCULAR | Status: DC | PRN

## 2019-12-25 MED ORDER — PROPOFOL 10 MG/ML IV BOLUS
INTRAVENOUS | Status: DC | PRN
  Administered 2019-12-25: 200 mg via INTRAVENOUS

## 2019-12-25 MED ORDER — ORAL CARE MOUTH RINSE: 15.0000 mL | Freq: Once | OROMUCOSAL | Status: AC

## 2019-12-25 MED ORDER — FENTANYL CITRATE (PF) 100 MCG/2ML IJ SOLN
INTRAMUSCULAR | Status: AC
  Filled 2019-12-25: qty 2

## 2019-12-25 MED ORDER — SUCCINYLCHOLINE CHLORIDE 200 MG/10ML IV SOSY
PREFILLED_SYRINGE | INTRAVENOUS | Status: AC
  Filled 2019-12-25: qty 10

## 2019-12-25 MED ORDER — BUPIVACAINE HCL 0.5 % IJ SOLN
INTRAMUSCULAR | Status: DC | PRN
  Administered 2019-12-25: 5 mL

## 2019-12-25 MED ORDER — FENTANYL CITRATE (PF) 100 MCG/2ML IJ SOLN
INTRAMUSCULAR | Status: DC | PRN
  Administered 2019-12-25 (×2): 50 ug via INTRAVENOUS

## 2019-12-25 MED ORDER — LACTATED RINGERS IV SOLN: INTRAVENOUS | Status: DC

## 2019-12-25 MED ORDER — LIDOCAINE HCL (PF) 2 % IJ SOLN
INTRAMUSCULAR | Status: AC
  Filled 2019-12-25: qty 5

## 2019-12-25 MED ORDER — ONDANSETRON HCL 4 MG/2ML IJ SOLN
INTRAMUSCULAR | Status: DC | PRN
  Administered 2019-12-25: 4 mg via INTRAVENOUS

## 2019-12-25 MED ORDER — ONDANSETRON HCL 4 MG/2ML IJ SOLN
INTRAMUSCULAR | Status: AC
  Filled 2019-12-25: qty 2

## 2019-12-25 MED ORDER — PROPOFOL 10 MG/ML IV BOLUS
INTRAVENOUS | Status: AC
  Filled 2019-12-25: qty 20

## 2019-12-25 MED ORDER — LIDOCAINE HCL (CARDIAC) PF 100 MG/5ML IV SOSY
PREFILLED_SYRINGE | INTRAVENOUS | Status: DC | PRN
  Administered 2019-12-25: 60 mg via INTRAVENOUS

## 2019-12-25 MED ORDER — BUPIVACAINE HCL (PF) 0.5 % IJ SOLN
INTRAMUSCULAR | Status: AC
  Filled 2019-12-25: qty 30

## 2019-12-25 MED ORDER — CHLORHEXIDINE GLUCONATE 0.12 % MT SOLN
OROMUCOSAL | Status: AC
  Administered 2019-12-25: 15 mL via OROMUCOSAL
  Filled 2019-12-25: qty 15

## 2019-12-25 MED ORDER — MIDAZOLAM HCL 2 MG/2ML IJ SOLN
INTRAMUSCULAR | Status: AC
  Filled 2019-12-25: qty 2

## 2019-12-25 SURGICAL SUPPLY — 32 items
APL PRP STRL LF DISP 70% ISPRP (MISCELLANEOUS) ×1
BNDG CMPR STD VLCR NS LF 5.8X3 (GAUZE/BANDAGES/DRESSINGS) ×1
BNDG ELASTIC 3X5.8 VLCR NS LF (GAUZE/BANDAGES/DRESSINGS) ×3 IMPLANT
CANISTER SUCT 1200ML W/VALVE (MISCELLANEOUS) ×3 IMPLANT
CAST PADDING 3X4FT ST 30246 (SOFTGOODS) ×2
CHLORAPREP W/TINT 26 (MISCELLANEOUS) ×3 IMPLANT
COVER WAND RF STERILE (DRAPES) ×3 IMPLANT
CUFF TOURN SGL QUICK 18X4 (TOURNIQUET CUFF) IMPLANT
ELECT CAUTERY NDL 2.0 MIC (NEEDLE) ×1 IMPLANT
ELECT CAUTERY NEEDLE 2.0 MIC (NEEDLE) ×3 IMPLANT
GAUZE SPONGE 4X4 12PLY STRL (GAUZE/BANDAGES/DRESSINGS) ×3 IMPLANT
GAUZE XEROFORM 1X8 LF (GAUZE/BANDAGES/DRESSINGS) ×3 IMPLANT
GLOVE SURG SYN 9.0  PF PI (GLOVE) ×2
GLOVE SURG SYN 9.0 PF PI (GLOVE) ×1 IMPLANT
GOWN SRG 2XL LVL 4 RGLN SLV (GOWNS) ×1 IMPLANT
GOWN STRL NON-REIN 2XL LVL4 (GOWNS) ×3
GOWN STRL REUS W/ TWL LRG LVL3 (GOWN DISPOSABLE) ×1 IMPLANT
GOWN STRL REUS W/TWL LRG LVL3 (GOWN DISPOSABLE) ×3
KIT TURNOVER KIT A (KITS) ×3 IMPLANT
NS IRRIG 500ML POUR BTL (IV SOLUTION) ×3 IMPLANT
PACK EXTREMITY (MISCELLANEOUS) ×3 IMPLANT
PAD CAST CTTN 3X4 STRL (SOFTGOODS) ×1 IMPLANT
PADDING CAST COTTON 3X4 STRL (SOFTGOODS) ×1
SCALPEL PROTECTED #15 DISP (BLADE) ×6 IMPLANT
SPLINT CAST 1 STEP 3X12 (MISCELLANEOUS) ×3 IMPLANT
SUT ETHILON 4-0 (SUTURE) ×3
SUT ETHILON 4-0 FS2 18XMFL BLK (SUTURE) ×1
SUT ETHILON 5-0 FS-2 18 BLK (SUTURE) ×3 IMPLANT
SUT MNCRL 4-0 (SUTURE) ×3
SUT MNCRL 4-018XMFL (SUTURE) ×1
SUTURE ETHLN 4-0 FS2 18XMF BLK (SUTURE) ×1 IMPLANT
SUTURE MNCRL 4-018XMF (SUTURE) ×1 IMPLANT

## 2019-12-25 NOTE — Anesthesia Procedure Notes (Signed)
Procedure Name: LMA Insertion Date/Time: 12/25/2019 10:38 AM Performed by: Lerry Liner, CRNA Pre-anesthesia Checklist: Patient identified, Emergency Drugs available, Suction available, Patient being monitored and Timeout performed Patient Re-evaluated:Patient Re-evaluated prior to induction Oxygen Delivery Method: Circle system utilized Preoxygenation: Pre-oxygenation with 100% oxygen Induction Type: IV induction Ventilation: Mask ventilation without difficulty LMA: LMA inserted LMA Size: 4.5 Number of attempts: 1 Placement Confirmation: positive ETCO2 and breath sounds checked- equal and bilateral Tube secured with: Tape

## 2019-12-25 NOTE — Transfer of Care (Signed)
Immediate Anesthesia Transfer of Care Note  Patient: Ryan Price.  Procedure(s) Performed: Right volar ganglion cyst excision (Right Wrist)  Patient Location: PACU  Anesthesia Type:General  Level of Consciousness: drowsy  Airway & Oxygen Therapy: Patient Spontanous Breathing and Patient connected to nasal cannula oxygen  Post-op Assessment: Report given to RN  Post vital signs: stable  Last Vitals:  Vitals Value Taken Time  BP    Temp    Pulse 72 12/25/19 1107  Resp 21 12/25/19 1107  SpO2 99 % 12/25/19 1107  Vitals shown include unvalidated device data.  Last Pain:  Vitals:   12/25/19 0809  TempSrc: Oral  PainSc: 2          Complications: No complications documented.
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
## 2019-12-25 NOTE — Anesthesia Postprocedure Evaluation (Signed)
Anesthesia Post Note  Patient: Ryan Price.  Procedure(s) Performed: Right volar ganglion cyst excision (Right Wrist)  Patient location during evaluation: PACU Anesthesia Type: General Level of consciousness: awake and alert and oriented Pain management: pain level controlled Vital Signs Assessment: post-procedure vital signs reviewed and stable Respiratory status: spontaneous breathing Cardiovascular status: blood pressure returned to baseline Anesthetic complications: no   No complications documented.   Last Vitals:  Vitals:   12/25/19 1137 12/25/19 1156  BP: 124/62 (!) 118/95  Pulse: 69 66  Resp: (!) 21 20  Temp:  (!) 36.3 C  SpO2: 96% 97%    Last Pain:  Vitals:   12/25/19 1156  TempSrc: Temporal  PainSc: 0-No pain                 Ermal Haberer

## 2019-12-25 NOTE — Anesthesia Preprocedure Evaluation (Addendum)
Anesthesia Evaluation  Patient identified by MRN, date of birth, ID band Patient awake    Reviewed: Allergy & Precautions, NPO status , Patient's Chart, lab work & pertinent test results  History of Anesthesia Complications Negative for: history of anesthetic complications  Airway Mallampati: II  TM Distance: >3 FB Neck ROM: Full    Dental  (+) Partial Upper, Poor Dentition   Pulmonary neg sleep apnea, neg COPD, Current Smoker and Patient abstained from smoking.,    breath sounds clear to auscultation- rhonchi (-) wheezing      Cardiovascular hypertension, Pt. on medications (-) CAD and (-) Past MI  Rhythm:Regular Rate:Normal - Systolic murmurs and - Diastolic murmurs    Neuro/Psych negative neurological ROS  negative psych ROS   GI/Hepatic Neg liver ROS, GERD  Medicated and Controlled,  Endo/Other  negative endocrine ROSneg diabetes  Renal/GU negative Renal ROS     Musculoskeletal negative musculoskeletal ROS (+)   Abdominal (+) + obese,   Peds  Hematology negative hematology ROS (+)   Anesthesia Other Findings Past Medical History: No date: GERD (gastroesophageal reflux disease) No date: Hypertension   Reproductive/Obstetrics                           Anesthesia Physical  Anesthesia Plan  ASA: III  Anesthesia Plan: General   Post-op Pain Management:    Induction: Intravenous  PONV Risk Score and Plan:   Airway Management Planned: Oral ETT and LMA  Additional Equipment:   Intra-op Plan:   Post-operative Plan: Extubation in OR  Informed Consent: I have reviewed the patients History and Physical, chart, labs and discussed the procedure including the risks, benefits and alternatives for the proposed anesthesia with the patient or authorized representative who has indicated his/her understanding and acceptance.     Dental advisory given  Plan Discussed with: CRNA and  Anesthesiologist  Anesthesia Plan Comments: (Patient reports no issues with reflux and is well controlled on meds.)       Anesthesia Quick Evaluation

## 2019-12-25 NOTE — Op Note (Signed)
12/25/2019  11:08 AM  PATIENT:  Ryan Price.  59 y.o. male  PRE-OPERATIVE DIAGNOSIS:  Acute pain of right wrist M25.531 Ganglion cyst of volar aspect of right wrist M67.431  POST-OPERATIVE DIAGNOSIS:  Acute pain of right wrist M25.531 Ganglion cyst of volar aspect of right wrist M67.431  PROCEDURE:  Procedure(s): Right volar ganglion cyst excision (Right)  SURGEON: Laurene Footman, MD  ASSISTANTS: None  ANESTHESIA:   general  EBL:  Total I/O In: 700 [I.V.:600; IV Piggyback:100] Out: -   BLOOD ADMINISTERED:none  DRAINS: none   LOCAL MEDICATIONS USED:  MARCAINE     SPECIMEN:  No Specimen  DISPOSITION OF SPECIMEN:  N/A  COUNTS:  YES  TOURNIQUET:  * Missing tourniquet times found for documented tourniquets in log: [id number] *  IMPLANTS: None  DICTATION: .Dragon Dictation patient was brought to the operating room and after adequate general anesthesia was obtained the right arm was prepped and draped in usual sterile fashion with a tourniquet applied to the upper forearm.  After prepping and draping in the usual sterile manner appropriate patient identification and timeout procedures were completed.  Tourniquet was raised and a curved incision was made over the distal radius centering over the FCR tendon and the ganglion.  The subcutaneous tissue was spread and the volar ganglion cyst was identified.  It was evacuated and tracked down to the radiocarpal joint where a small opening was identified abraded with use of an elevator and then cauterized to try to cause scar tissue and prevent recurrence.  The wound was then irrigated and tourniquet let down hemostasis achieved electrocautery.  The wound was closed with simple erupted 4-0 nylon skin sutures followed by Xeroform 4 x 4 web roll volar splint and Ace wrap  PLAN OF CARE: Discharge to home after PACU  PATIENT DISPOSITION:  PACU - hemodynamically stable.

## 2019-12-25 NOTE — Discharge Instructions (Addendum)
AMBULATORY SURGERY  DISCHARGE INSTRUCTIONS   1) The drugs that you were given will stay in your system until tomorrow so for the next 24 hours you should not:  A) Drive an automobile B) Make any legal decisions C) Drink any alcoholic beverage   2) You may resume regular meals tomorrow.  Today it is better to start with liquids and gradually work up to solid foods.  You may eat anything you prefer, but it is better to start with liquids, then soup and crackers, and gradually work up to solid foods.   3) Please notify your doctor immediately if you have any unusual bleeding, trouble breathing, redness and pain at the surgery site, drainage, fever, or pain not relieved by medication.    4) Additional Instructions:        Please contact your physician with any problems or Same Day Surgery at [phone], Monday through Friday 6 am to 4 pm, or [place] [facility] number at [phone].Try to keep splint clean and dry until return visit.  Work with your fingers is much as you feel comfortable. Pain medicine as directed.

## 2019-12-29 DIAGNOSIS — M67431 Ganglion, right wrist: Secondary | ICD-10-CM | POA: Diagnosis not present

## 2020-02-10 ENCOUNTER — Encounter: Payer: Self-pay | Admitting: Anesthesiology

## 2020-02-10 ENCOUNTER — Ambulatory Visit
Admission: RE | Admit: 2020-02-10 | Discharge: 2020-02-10 | Disposition: A | Discharge status: Home / Self Care | Payer: Medicare HMO | Source: Ambulatory Visit | Attending: Anesthesiology | Admitting: Anesthesiology

## 2020-02-10 ENCOUNTER — Other Ambulatory Visit: Payer: Self-pay | Admitting: Anesthesiology

## 2020-02-10 ENCOUNTER — Other Ambulatory Visit: Payer: Self-pay

## 2020-02-10 ENCOUNTER — Ambulatory Visit (HOSPITAL_BASED_OUTPATIENT_CLINIC_OR_DEPARTMENT_OTHER): Payer: Medicare HMO | Admitting: Anesthesiology

## 2020-02-10 VITALS — BP 97/59 | HR 79 | Temp 97.3°F | Resp 19 | Ht 75.0 in | Wt 260.0 lb

## 2020-02-10 DIAGNOSIS — G894 Chronic pain syndrome: Secondary | ICD-10-CM | POA: Diagnosis not present

## 2020-02-10 DIAGNOSIS — G8929 Other chronic pain: Secondary | ICD-10-CM

## 2020-02-10 DIAGNOSIS — R52 Pain, unspecified: Secondary | ICD-10-CM | POA: Insufficient documentation

## 2020-02-10 DIAGNOSIS — M5136 Other intervertebral disc degeneration, lumbar region: Secondary | ICD-10-CM | POA: Insufficient documentation

## 2020-02-10 DIAGNOSIS — M47816 Spondylosis without myelopathy or radiculopathy, lumbar region: Secondary | ICD-10-CM

## 2020-02-10 DIAGNOSIS — F1190 Opioid use, unspecified, uncomplicated: Secondary | ICD-10-CM | POA: Diagnosis not present

## 2020-02-10 DIAGNOSIS — M545 Low back pain, unspecified: Secondary | ICD-10-CM

## 2020-02-10 DIAGNOSIS — M79605 Pain in left leg: Secondary | ICD-10-CM | POA: Insufficient documentation

## 2020-02-10 HISTORY — DX: Low back pain, unspecified: M54.50

## 2020-02-10 MED ORDER — LIDOCAINE HCL (PF) 1 % IJ SOLN
INTRAMUSCULAR | Status: AC
  Filled 2020-02-10: qty 5

## 2020-02-10 MED ORDER — ROPIVACAINE HCL 2 MG/ML IJ SOLN
10.0000 mL | Freq: Once | INTRAMUSCULAR | Status: AC
  Administered 2020-02-10: 9 mL via EPIDURAL

## 2020-02-10 MED ORDER — HYDROCODONE-ACETAMINOPHEN 10-325 MG PO TABS: 1.0000 | ORAL_TABLET | Freq: Four times a day (QID) | ORAL | 0 refills | Status: DC | PRN

## 2020-02-10 MED ORDER — ROPIVACAINE HCL 2 MG/ML IJ SOLN
INTRAMUSCULAR | Status: AC
  Filled 2020-02-10: qty 10

## 2020-02-10 MED ORDER — HYDROCODONE-ACETAMINOPHEN 10-325 MG PO TABS: 1.0000 | ORAL_TABLET | Freq: Four times a day (QID) | ORAL | 0 refills | Status: AC | PRN

## 2020-02-10 MED ORDER — FENTANYL CITRATE (PF) 100 MCG/2ML IJ SOLN: 100.0000 ug | Freq: Once | INTRAMUSCULAR | Status: DC

## 2020-02-10 MED ORDER — MIDAZOLAM HCL 2 MG/2ML IJ SOLN
5.0000 mg | Freq: Once | INTRAMUSCULAR | Status: AC
  Administered 2020-02-10: 2 mg via INTRAVENOUS
  Filled 2020-02-10: qty 5

## 2020-02-10 MED ORDER — TRIAMCINOLONE ACETONIDE 40 MG/ML IJ SUSP
INTRAMUSCULAR | Status: AC
  Filled 2020-02-10: qty 1

## 2020-02-10 MED ORDER — SODIUM CHLORIDE 0.9% FLUSH: 10.0000 mL | Freq: Once | INTRAVENOUS | Status: DC

## 2020-02-10 MED ORDER — LACTATED RINGERS IV SOLN
1000.0000 mL | INTRAVENOUS | Status: DC
  Administered 2020-02-10: 1000 mL via INTRAVENOUS

## 2020-02-10 MED ORDER — MIDAZOLAM HCL 5 MG/5ML IJ SOLN
INTRAMUSCULAR | Status: AC
  Filled 2020-02-10: qty 5

## 2020-02-10 MED ORDER — TRIAMCINOLONE ACETONIDE 40 MG/ML IJ SUSP: 40.0000 mg | Freq: Once | INTRAMUSCULAR | Status: DC

## 2020-02-10 NOTE — Progress Notes (Signed)
Subjective:  Patient ID: Ryan Schwalbe., male    DOB: 1960-08-14  Age: 59 y.o. MRN: [id number]  CC: Back Pain (lower)   Procedure: Right side medial branch block to the lumbar facet under fluoroscopic guidance at L2-3 L3-4 L4-5 L5-S1  HPI Ryan Schwalbe. presents for reevaluation.  Ryan Price has had previous facet blocks back in May and July each one giving him approximately 75 to 80% relief of his right lower back pain and right posterior lateral upper leg pain lasting approximately 4 to 6 weeks.  He states that approximately 3 to 5 days after the injection he begins to get some gradual slow recurrence return of his low back pain and leg pain but this is much better controlled and more tolerable and over the course of the following 6 to 7 weeks he gets complete return of his low back pain and leg pain.  The quality characteristic distribution of this have been unchanged and stable in nature.  He takes his hydrocodone 10 mg tablets four times a day and this works well keeping his pain under good control as well.  Otherwise no changes in lower extremity strength or function or bowel or bladder function noted at this time.  His pain complex remained stable he reports.  Outpatient Medications Prior to Visit  Medication Sig Dispense Refill  . BYSTOLIC 10 MG tablet Take 10 mg by mouth daily.     . fluticasone (FLONASE) 50 MCG/ACT nasal spray Place 1-2 sprays into both nostrils daily as needed (sinus issues/runny nose).     Marland Kitchen lisinopril (PRINIVIL,ZESTRIL) 20 MG tablet Take 20 mg by mouth daily.    Marland Kitchen lovastatin (MEVACOR) 20 MG tablet Take 20 mg by mouth daily.     Marland Kitchen omeprazole (PRILOSEC) 40 MG capsule Take 40 mg by mouth daily before breakfast.     . SYMBICORT 160-4.5 MCG/ACT inhaler Inhale 1 puff into the lungs at bedtime.     Marland Kitchen HYDROcodone-acetaminophen (NORCO) 10-325 MG tablet Take 1 tablet by mouth every 6 (six) hours as needed for moderate pain or severe pain. 120 tablet 0  .  oxyCODONE (ROXICODONE) 5 MG immediate release tablet Take 1 tablet (5 mg total) by mouth every 8 (eight) hours as needed. (Patient not taking: Reported on 02/10/2020) 20 tablet 0   No facility-administered medications prior to visit.    Review of Systems CNS: No confusion or sedation Cardiac: No angina or palpitations GI: No abdominal pain or constipation Constitutional: No nausea vomiting fevers or chills  Objective:  BP (!) 97/57   Pulse 79   Temp (!) 97.3 F (36.3 C) (Temporal)   Resp 19   Ht 6\' 3"  (1.905 m)   Wt 260 lb (117.9 kg)   SpO2 95%   BMI 32.50 kg/m    BP Readings from Last 3 Encounters:  02/10/20 (!) 97/57  12/25/19 (!) 118/95  12/09/19 (!) 94/63     Wt Readings from Last 3 Encounters:  02/10/20 260 lb (117.9 kg)  12/25/19 255 lb (115.7 kg)  12/18/19 258 lb (117 kg)     Physical Exam Pt is alert and oriented PERRL EOMI HEART IS RRR no murmur or rub LCTA no wheezing or rales MUSCULOSKELETAL reveals some paraspinous muscle tenderness in the right lumbar region but no overt trigger points.  He has pain on extension with right lateral rotation while in the standing position.  This is less pronounced with left lateral rotation.  His muscle tone and bulk  to the lower extremities is at baseline  Labs  No results found for: HGBA1C Lab Results  Component Value Date   CREATININE 0.81 05/26/2015    -------------------------------------------------------------------------------------------------------------------- Lab Results  Component Value Date   WBC 13.6 (H) 05/26/2015   HGB 13.9 04/26/2016   HCT 48.0 05/26/2015   PLT 242 05/26/2015   GLUCOSE 117 (H) 05/26/2015   ALT 28 05/26/2015   AST 21 05/26/2015   NA 138 05/26/2015   K 4.1 05/26/2015   CL 112 (H) 05/26/2015   CREATININE 0.81 05/26/2015   BUN 13 05/26/2015   CO2 21 (L) 05/26/2015     --------------------------------------------------------------------------------------------------------------------- No results found.   Assessment & Plan:   Ryan Price was seen today for back pain.  Diagnoses and all orders for this visit:  Chronic, continuous use of opioids  Lumbar facet arthropathy -     LUMBAR FACET(MEDIAL BRANCH NERVE BLOCK) MBNB  Spondylosis of lumbar region without myelopathy or radiculopathy -     LUMBAR FACET(MEDIAL BRANCH NERVE BLOCK) MBNB  DDD (degenerative disc disease), lumbar -     LUMBAR FACET(MEDIAL BRANCH NERVE BLOCK) MBNB  Chronic pain syndrome  Chronic right-sided low back pain without sciatica  Other orders -     Radiofrequency,Lumbar; Standing -     Radiofrequency,Lumbar -     triamcinolone acetonide (KENALOG-40) injection 40 mg -     sodium chloride flush (NS) 0.9 % injection 10 mL -     ropivacaine (PF) 2 mg/mL (0.2%) (NAROPIN) injection 10 mL -     midazolam (VERSED) injection 5 mg -     lactated ringers infusion 1,000 mL -     fentaNYL (SUBLIMAZE) injection 100 mcg -     HYDROcodone-acetaminophen (NORCO) 10-325 MG tablet; Take 1 tablet by mouth every 6 (six) hours as needed for moderate pain or severe pain. -     HYDROcodone-acetaminophen (NORCO) 10-325 MG tablet; Take 1 tablet by mouth every 6 (six) hours as needed for moderate pain or severe pain.        ----------------------------------------------------------------------------------------------------------------------  Problem List Items Addressed This Visit      Unprioritized   Chronic pain   Relevant Medications   triamcinolone acetonide (KENALOG-40) injection 40 mg   fentaNYL (SUBLIMAZE) injection 100 mcg   HYDROcodone-acetaminophen (NORCO) 10-325 MG tablet (Start on 02/23/2020)   HYDROcodone-acetaminophen (NORCO) 10-325 MG tablet (Start on 03/23/2020)   Chronic, continuous use of opioids - Primary   DDD (degenerative disc disease), lumbar   Relevant Medications    triamcinolone acetonide (KENALOG-40) injection 40 mg   fentaNYL (SUBLIMAZE) injection 100 mcg   HYDROcodone-acetaminophen (NORCO) 10-325 MG tablet (Start on 02/23/2020)   HYDROcodone-acetaminophen (NORCO) 10-325 MG tablet (Start on 03/23/2020)   Right low back pain   Relevant Medications   triamcinolone acetonide (KENALOG-40) injection 40 mg   fentaNYL (SUBLIMAZE) injection 100 mcg   HYDROcodone-acetaminophen (NORCO) 10-325 MG tablet (Start on 02/23/2020)   HYDROcodone-acetaminophen ([place]) 10-325 MG tablet (Start on 03/23/2020)    Other Visit Diagnoses    Lumbar facet arthropathy       Spondylosis of lumbar region without myelopathy or radiculopathy       Relevant Medications   triamcinolone acetonide (KENALOG-40) injection 40 mg   fentaNYL (SUBLIMAZE) injection 100 mcg   HYDROcodone-acetaminophen (NORCO) 10-325 MG tablet (Start on 02/23/2020)   HYDROcodone-acetaminophen (NORCO) 10-325 MG tablet (Start on 03/23/2020)        ----------------------------------------------------------------------------------------------------------------------  1. Lumbar facet arthropathy .  We'll plan on doing a repeat therapeutic  lumbar facet block today.  Of gone over the risks and benefits of the procedure with him in full detail and all questions are answered.  He desires to proceed with the radiofrequency ablation his next visit in 2 months if possible.  In the meantime I want him to continue with core stretching strengthening exercises once again reviewed.  We'll keep his medication management unchanged.  No other changes are made today in his pain management protocol. - LUMBAR FACET(MEDIAL BRANCH NERVE BLOCK) MBNB  2. Spondylosis of lumbar region without myelopathy or radiculopathy As above - LUMBAR FACET(MEDIAL BRANCH NERVE BLOCK) MBNB  3. DDD (degenerative disc disease), lumbar As above - LUMBAR FACET(MEDIAL BRANCH NERVE BLOCK) MBNB  4. Chronic, continuous use of opioids I have reviewed  the [facility] practitioner database information and it is appropriate for refills.  These will be dated for October 11 and November 9 with return to clinic in 2 months.  He is encouraged to continue follow-up with his primary care physicians for his baseline medical care.  5. Chronic pain syndrome As above  6. Chronic right-sided low back pain without sciatica As above    ----------------------------------------------------------------------------------------------------------------------  I am having Ryan Schwalbe. start on HYDROcodone-acetaminophen. I am also having him maintain his lisinopril, Bystolic, fluticasone, lovastatin, omeprazole, Symbicort, oxyCODONE, and HYDROcodone-acetaminophen. We administered ropivacaine (PF) 2 mg/mL (0.2%), midazolam, and lactated ringers.   Meds ordered this encounter  Medications  . triamcinolone acetonide (KENALOG-40) injection 40 mg  . sodium chloride flush (NS) 0.9 % injection 10 mL  . ropivacaine (PF) 2 mg/mL (0.2%) (NAROPIN) injection 10 mL  . midazolam (VERSED) injection 5 mg  . lactated ringers infusion 1,000 mL  . fentaNYL (SUBLIMAZE) injection 100 mcg  . HYDROcodone-acetaminophen (NORCO) 10-325 MG tablet    Sig: Take 1 tablet by mouth every 6 (six) hours as needed for moderate pain or severe pain.    Dispense:  120 tablet    Refill:  0  . HYDROcodone-acetaminophen (NORCO) 10-325 MG tablet    Sig: Take 1 tablet by mouth every 6 (six) hours as needed for moderate pain or severe pain.    Dispense:  90 tablet    Refill:  0   Patient's Medications  New Prescriptions   HYDROCODONE-ACETAMINOPHEN (NORCO) 10-325 MG TABLET    Take 1 tablet by mouth every 6 (six) hours as needed for moderate pain or severe pain.  Previous Medications   BYSTOLIC 10 MG TABLET    Take 10 mg by mouth daily.    FLUTICASONE (FLONASE) 50 MCG/ACT NASAL SPRAY    Place 1-2 sprays into both nostrils daily as needed (sinus issues/runny nose).    LISINOPRIL  (PRINIVIL,ZESTRIL) 20 MG TABLET    Take 20 mg by mouth daily.   LOVASTATIN (MEVACOR) 20 MG TABLET    Take 20 mg by mouth daily.    OMEPRAZOLE (PRILOSEC) 40 MG CAPSULE    Take 40 mg by mouth daily before breakfast.    OXYCODONE (ROXICODONE) 5 MG IMMEDIATE RELEASE TABLET    Take 1 tablet (5 mg total) by mouth every 8 (eight) hours as needed.   SYMBICORT 160-4.5 MCG/ACT INHALER    Inhale 1 puff into the lungs at bedtime.   Modified Medications   Modified Medication Previous Medication   HYDROCODONE-ACETAMINOPHEN (NORCO) 10-325 MG TABLET HYDROcodone-acetaminophen (NORCO) 10-325 MG tablet      Take 1 tablet by mouth every 6 (six) hours as needed for moderate pain or severe pain.    Take  1 tablet by mouth every 6 (six) hours as needed for moderate pain or severe pain.  Discontinued Medications   No medications on file   ----------------------------------------------------------------------------------------------------------------------  Follow-up: Return in about 2 months (around 04/11/2020) for procedure, evaluation.  Procedure: Right side lumbar medial branch block under fluoroscopic guidance at L2-3 L3-4 L4-5 L5-S1 with sedation Patient was taken to the fluoroscopy suite and placed in the prone position.Vital signs were stable throughout the procedure. The  overlying area of skin on the right side was prepped with Betadine 3 and strict aseptic technique was utilized throughout the procedure. Flouroscopy was used to  identify the areas overlying the aforementioned facets at  L2-3 L3-4  L4-5 and  L5-S1. 1% lidocaine 1 cc was infiltrated subcutaneously and into the fascia with a 25-gauge needle at each of these sites. I then advanced a 22-gauge 3-1/2 inch Quinckie needle with the needle tip to lie at the "[facility]" portion of the [employer] dog". There was negative aspiration for heme or CSF and  no paresthesia. I then injected 2 cc of ropivacaine 0.2% mixed with 10 mg of triamcinolone at each of the  aforementioned sites. These needles were withdrawn.  The patient tolerated the procedure without any difficulty and was convalesced discharged home in stable condition for follow-up as mentioned  Molli Barrows, MD

## 2020-02-10 NOTE — Progress Notes (Signed)
Nursing Pain Medication Assessment:  Safety precautions to be maintained throughout the outpatient stay will include: orient to surroundings, keep bed in low position, maintain call bell within reach at all times, provide assistance with transfer out of bed and ambulation.  Medication Inspection Compliance: Ryan Price did not comply with our request to bring his pills to be counted. He was reminded that bringing the medication bottles, even when empty, is a requirement.  Medication: None brought in. Pill/Patch Count: None available to be counted. Bottle Appearance: No container available. Did not bring bottle(s) to appointment. Filled Date: N/A Last Medication intake:  Today

## 2020-02-10 NOTE — Patient Instructions (Signed)
____________________________________________________________________________________________  Post-procedure Information What to expect: Most procedures involve the use of a local anesthetic (numbing medicine), and a steroid (anti-inflammatory medicine).  The local anesthetics may cause temporary numbness and weakness of the legs or arms, depending on the location of the block. This numbness/weakness may last 4-6 hours, depending on the local anesthetic used. In rare instances, it can last up to 24 hours. While numb, you must be very careful not to injure the extremity.  After any procedure, you could expect the pain to get better within 15-20 minutes. This relief is temporary and may last 4-6 hours. Once the local anesthetics wears off, you could experience discomfort, possibly more than usual, for up to 10 (ten) days. In the case of radiofrequencies, it may last up to 6 weeks. Surgeries may take up to 8 weeks for the healing process. The discomfort is due to the irritation caused by needles going through skin and muscle. To minimize the discomfort, we recommend using ice the first day, and heat from then on. The ice should be applied for 15 minutes on, and 15 minutes off. Keep repeating this cycle until bedtime. Avoid applying the ice directly to the skin, to prevent frostbite. Heat should be used daily, until the pain improves (4-10 days). Be careful not to burn yourself.  Occasionally you may experience muscle spasms or cramps. These occur as a consequence of the irritation caused by the needle sticks to the muscle and the blood that will inevitably be lost into the surrounding muscle tissue. Blood tends to be very irritating to tissues, which tend to react by going into spasm. These spasms may start the same day of your procedure, but they may also take days to develop. This late onset type of spasm or cramp is usually caused by electrolyte imbalances triggered by the steroids, at the level of the  kidney. Cramps and spasms tend to respond well to muscle relaxants, multivitamins (some are triggered by the procedure, but may have their origins in vitamin deficiencies), and "Gatorade", or any sports drinks that can replenish any electrolyte imbalances. (If you are a diabetic, ask your pharmacist to get you a sugar-free brand.) Warm showers or baths may also be helpful. Stretching exercises are highly recommended.  General Instructions:  Be alert for signs of possible infection: redness, swelling, heat, red streaks, elevated temperature, and/or fever. These typically appear 4 to 6 days after the procedure. Immediately notify your doctor if you experience unusual bleeding, difficulty breathing, or loss of bowel or bladder control. If you experience increased pain, do not increase your pain medicine intake, unless instructed by your pain physician.  Post-Procedure Care:  Be careful in moving about. Muscle spasms in the area of the injection may occur. Applying ice or heat to the area is often helpful. The incidence of spinal headaches after epidural injections ranges between 1.4% and 6%. If you develop a headache that does not seem to respond to conservative therapy, please let your physician know. This can be treated with an epidural blood patch.   Post-procedure numbness or redness is to be expected, however it should average 4 to 6 hours. If numbness and weakness of your extremities begins to develop 4 to 6 hours after your procedure, and is felt to be progressing and worsening, immediately contact your physician.  Diet:  If you experience nausea, do not eat until this sensation goes away. If you had a "Stellate Ganglion Block" for upper extremity "Reflex Sympathetic Dystrophy", do not eat or   drink until your hoarseness goes away. In any case, always start with liquids first and if you tolerate them well, then slowly progress to more solid foods.  Activity:  For the first 4 to 6 hours after the  procedure, use caution in moving about as you may experience numbness and/or weakness. Use caution in cooking, using household electrical appliances, and climbing steps. If you need to reach your Doctor call our office: [phone] (During business hours) or [phone] (After business hours).  Business Hours: Monday-Thursday 8:00 am - 4:00 PM    Fridays: Closed     In case of an emergency: In case of emergency, call 911 or go to the nearest emergency room and have the physician there call us.  Interpretation of Procedure Every nerve block has two components: a diagnostic component, and a treatment component. Unrealistic expectations are the most common causes of "perceived failure".  In a perfect world, a single nerve block should be able to completely and permanently eliminate the pain. Sadly, the world is not perfect.  Most pain management nerve blocks are performed using local anesthetics and steroids. Steroids are responsible for any long-term benefit that you may experience. Their purpose is to decrease any chronic swelling that may exist in the area. Steroids begin to work immediately after being injected. However, most patients will not experience any benefits until 5 to 10 days after the injection, when the swelling has come down to the point where they can tell a difference. Steroids will only help if there is swelling to be treated. As such, they can assist with the diagnosis. If effective, they suggest an inflammatory component to the pain, and if ineffective, they rule out inflammation as the main cause or component of the problem. If the problem is one of mechanical compression, you will get no benefit from those steroids.   In the case of local anesthetics, they have a crucial role in the diagnosis of your condition. Most will begin to work within15 to 20 minutes after injection. The duration will depend on the type used (short- vs. Long-acting). It is of outmost importance that  patients keep tract of their pain, after the procedure. To assist with this matter, a "Post-procedure Pain Diary" is provided. Make sure to complete it and to bring it back to your follow-up appointment.  As long as the patient keeps accurate, detailed records of their symptoms after every procedure, and returns to have those interpreted, every procedure will provide us with invaluable information. Even a block that does not provide the patient with any relief, will always provide us with information about the mechanism and the origin of the pain. The only time a nerve block can be considered a waste of time is when patients do not keep track of the results, or do not keep their post-procedure appointment.  Reporting the results back to your physician The Pain Score  Pain is a subjective complaint. It cannot be seen, touched, or measured. We depend entirely on the patient's report of the pain in order to assess your condition and treatment. To evaluate the pain, we use a pain scale, where "0" means "No Pain", and a "10" is "the worst possible pain that you can even imagine" (i.e. something like been eaten alive by a shark or being torn apart by a lion).   Use the Pain Scale provided. You will frequently be asked to rate your pain. Please be accurate, remember that medical decisions will be based on your   responses. Please do not rate your pain above a 10. Doing so is actually interpreted as "symptom magnification" (exaggeration). To put this into perspective, when you tell us that your pain is at a 10 (ten), what you are saying is that there is nothing we can do to make this pain any worse. (Carefully think about that.) ____________________________________________________________________________________________   ____________________________________________________________________________________________  Preparing for Procedure with Sedation  Procedure appointments are limited to planned  procedures: . No Prescription Refills. . No disability issues will be discussed. . No medication changes will be discussed.  Instructions: . Oral Intake: Do not eat or drink anything for at least 8 hours prior to your procedure. (Exception: Blood Pressure Medication. See below.) . Transportation: Unless otherwise stated by your physician, you may drive yourself after the procedure. . Blood Pressure Medicine: Do not forget to take your blood pressure medicine with a sip of water the morning of the procedure. If your Diastolic (lower reading)is above 100 mmHg, elective cases will be cancelled/rescheduled. . Blood thinners: These will need to be stopped for procedures. Notify our staff if you are taking any blood thinners. Depending on which one you take, there will be specific instructions on how and when to stop it. . Diabetics on insulin: Notify the staff so that you can be scheduled 1st case in the morning. If your diabetes requires high dose insulin, take only  of your normal insulin dose the morning of the procedure and notify the staff that you have done so. . Preventing infections: Shower with an antibacterial soap the morning of your procedure. . Build-up your immune system: Take 1000 mg of Vitamin C with every meal (3 times a day) the day prior to your procedure. Marland Kitchen Antibiotics: Inform the staff if you have a condition or reason that requires you to take antibiotics before dental procedures. . Pregnancy: If you are pregnant, call and cancel the procedure. . Sickness: If you have a cold, fever, or any active infections, call and cancel the procedure. . Arrival: You must be in the facility at least 30 minutes prior to your scheduled procedure. . Children: Do not bring children with you. . Dress appropriately: Bring dark clothing that you would not mind if they get stained. . Valuables: Do not bring any jewelry or valuables.  Reasons to call and reschedule or cancel your procedure:  (Following these recommendations will minimize the risk of a serious complication.) . Surgeries: Avoid having procedures within 2 weeks of any surgery. (Avoid for 2 weeks before or after any surgery). . Flu Shots: Avoid having procedures within 2 weeks of a flu shots or . (Avoid for 2 weeks before or after immunizations). . Barium: Avoid having a procedure within 7-10 days after having had a radiological study involving the use of radiological contrast. (Myelograms, Barium swallow or enema study). . Heart attacks: Avoid any elective procedures or surgeries for the initial 6 months after a "Myocardial Infarction" (Heart Attack). . Blood thinners: It is imperative that you stop these medications before procedures. Let us know if you if you take any blood thinner.  . Infection: Avoid procedures during or within two weeks of an infection (including chest colds or gastrointestinal problems). Symptoms associated with infections include: Localized redness, fever, chills, night sweats or profuse sweating, burning sensation when voiding, cough, congestion, stuffiness, runny nose, sore throat, diarrhea, nausea, vomiting, cold or Flu symptoms, recent or current infections. It is specially important if the infection is over the area that we intend to treat. Marland Kitchen  Heart and lung problems: Symptoms that may suggest an active cardiopulmonary problem include: cough, chest pain, breathing difficulties or shortness of breath, dizziness, ankle swelling, uncontrolled high or unusually low blood pressure, and/or palpitations. If you are experiencing any of these symptoms, cancel your procedure and contact your primary care physician for an evaluation.  Remember:  Regular Business hours are:  Monday to Thursday 8:00 AM to 4:00 PM  Provider's Schedule: Milinda Pointer, MD:  Procedure days: Tuesday and Thursday 7:30 AM to 4:00 PM  Gillis Santa, MD:  Procedure days: Monday and Wednesday 7:30 AM to 4:00  PM ____________________________________________________________________________________________   ____________________________________________________________________________________________  Medication Rules  Purpose: To inform patients, and their family members, of our rules and regulations.  Applies to: All patients receiving prescriptions (written or electronic).  Pharmacy of record: Pharmacy where electronic prescriptions will be sent. If written prescriptions are taken to a different pharmacy, please inform the nursing staff. The pharmacy listed in the electronic medical record should be the one where you would like electronic prescriptions to be sent.  Electronic prescriptions: In compliance with the [place] (STOP) Act of 2017 (Session Lanny Cramp [phone]), effective May 15, 2018, all controlled substances must be electronically prescribed. Calling prescriptions to the pharmacy will cease to exist.  Prescription refills: Only during scheduled appointments. Applies to all prescriptions.  NOTE: The following applies primarily to controlled substances (Opioid* Pain Medications).   Type of encounter (visit): For patients receiving controlled substances, face-to-face visits are required. (Not an option or up to the patient.)  Patient's responsibilities: 1. Pain Pills: Bring all pain pills to every appointment (except for procedure appointments). 2. Pill Bottles: Bring pills in original pharmacy bottle. Always bring the newest bottle. Bring bottle, even if empty. 3. Medication refills: You are responsible for knowing and keeping track of what medications you take and those you need refilled. The day before your appointment: write a list of all prescriptions that need to be refilled. The day of the appointment: give the list to the admitting nurse. Prescriptions will be written only during appointments. No prescriptions will be written on  procedure days. If you forget a medication: it will not be "Called in", "Faxed", or "electronically sent". You will need to get another appointment to get these prescribed. No early refills. Do not call asking to have your prescription filled early. 4. Prescription Accuracy: You are responsible for carefully inspecting your prescriptions before leaving our office. Have the discharge nurse carefully go over each prescription with you, before taking them home. Make sure that your name is accurately spelled, that your address is correct. Check the name and dose of your medication to make sure it is accurate. Check the number of pills, and the written instructions to make sure they are clear and accurate. Make sure that you are given enough medication to last until your next medication refill appointment. 5. Taking Medication: Take medication as prescribed. When it comes to controlled substances, taking less pills or less frequently than prescribed is permitted and encouraged. Never take more pills than instructed. Never take medication more frequently than prescribed.  6. Inform other Doctors: Always inform, all of your healthcare providers, of all the medications you take. 7. Pain Medication from other Providers: You are not allowed to accept any additional pain medication from any other Doctor or Healthcare provider. There are two exceptions to this rule. (see below) In the event that you require additional pain medication, you are responsible for notifying us, as  stated below. 8. Medication Agreement: You are responsible for carefully reading and following our Medication Agreement. This must be signed before receiving any prescriptions from our practice. Safely store a copy of your signed Agreement. Violations to the Agreement will result in no further prescriptions. (Additional copies of our Medication Agreement are available upon request.) 9. Laws, Rules, & Regulations: All patients are expected to  follow all Federal and [employer], Rules, [employer]. Ignorance of the Laws does not constitute a valid excuse.  10. Illegal drugs and Controlled Substances: The use of illegal substances (including, but not limited to marijuana and its derivatives) and/or the illegal use of any controlled substances is strictly prohibited. Violation of this rule may result in the immediate and permanent discontinuation of any and all prescriptions being written by our practice. The use of any illegal substances is prohibited. 11. Adopted CDC guidelines & recommendations: Target dosing levels will be at or below 60 MME/day. Use of benzodiazepines** is not recommended.  Exceptions: There are only two exceptions to the rule of not receiving pain medications from other Healthcare Providers. 1. Exception #1 (Emergencies): In the event of an emergency (i.e.: accident requiring emergency care), you are allowed to receive additional pain medication. However, you are responsible for: As soon as you are able, call our office [phone], at any time of the day or night, and leave a message stating your name, the date and nature of the emergency, and the name and dose of the medication prescribed. In the event that your call is answered by a member of our staff, make sure to document and save the date, time, and the name of the person that took your information.  2. Exception #2 (Planned Surgery): In the event that you are scheduled by another doctor or dentist to have any type of surgery or procedure, you are allowed (for a period no longer than 30 days), to receive additional pain medication, for the acute post-op pain. However, in this case, you are responsible for picking up a copy of our "Post-op Pain Management for Surgeons" handout, and giving it to your surgeon or dentist. This document is available at our office, and does not require an appointment to obtain it. Simply go to our office during business hours  (Monday-Thursday from 8:00 AM to 4:00 PM) (Friday 8:00 AM to 12:00 Noon) or if you have a scheduled appointment with [place], prior to your surgery, and ask for it by name. In addition, you are responsible for: calling our office [phone], at any time of the day or night, and leaving a message stating your name, name of your surgeon, type of surgery, and date of procedure or surgery. Failure to comply with your responsibilities may result in termination of therapy involving the controlled substances.  *Opioid medications include: morphine, codeine, oxycodone, oxymorphone, hydrocodone, hydromorphone, meperidine, tramadol, tapentadol, buprenorphine, fentanyl, methadone. **Benzodiazepine medications include: diazepam (Valium), alprazolam (Xanax), clonazepam (Klonopine), lorazepam (Ativan), clorazepate (Tranxene), chlordiazepoxide (Librium), estazolam (Prosom), oxazepam (Serax), temazepam (Restoril), triazolam (Halcion) (Last updated: 01/20/2020) ____________________________________________________________________________________________

## 2020-03-25 ENCOUNTER — Ambulatory Visit
Admission: RE | Admit: 2020-03-25 | Discharge: 2020-03-25 | Disposition: A | Discharge status: Home / Self Care | Payer: Medicare Other | Source: Ambulatory Visit | Attending: Anesthesiology | Admitting: Anesthesiology

## 2020-03-25 ENCOUNTER — Encounter: Payer: Self-pay | Admitting: Anesthesiology

## 2020-03-25 ENCOUNTER — Other Ambulatory Visit: Payer: Self-pay

## 2020-03-25 ENCOUNTER — Ambulatory Visit (HOSPITAL_BASED_OUTPATIENT_CLINIC_OR_DEPARTMENT_OTHER): Payer: Medicare Other | Admitting: Anesthesiology

## 2020-03-25 ENCOUNTER — Other Ambulatory Visit: Payer: Self-pay | Admitting: Anesthesiology

## 2020-03-25 VITALS — BP 148/100 | HR 36 | Temp 97.2°F | Resp 16 | Ht 75.0 in | Wt 260.0 lb

## 2020-03-25 DIAGNOSIS — G894 Chronic pain syndrome: Secondary | ICD-10-CM | POA: Insufficient documentation

## 2020-03-25 DIAGNOSIS — M545 Low back pain, unspecified: Secondary | ICD-10-CM | POA: Insufficient documentation

## 2020-03-25 DIAGNOSIS — F1190 Opioid use, unspecified, uncomplicated: Secondary | ICD-10-CM | POA: Insufficient documentation

## 2020-03-25 DIAGNOSIS — M5136 Other intervertebral disc degeneration, lumbar region: Secondary | ICD-10-CM

## 2020-03-25 DIAGNOSIS — G8929 Other chronic pain: Secondary | ICD-10-CM | POA: Diagnosis present

## 2020-03-25 DIAGNOSIS — M47816 Spondylosis without myelopathy or radiculopathy, lumbar region: Secondary | ICD-10-CM

## 2020-03-25 DIAGNOSIS — R52 Pain, unspecified: Secondary | ICD-10-CM

## 2020-03-25 MED ORDER — MIDAZOLAM HCL 5 MG/5ML IJ SOLN
INTRAMUSCULAR | Status: AC
  Filled 2020-03-25: qty 5

## 2020-03-25 MED ORDER — SODIUM CHLORIDE 0.9% FLUSH: 10.0000 mL | Freq: Once | INTRAVENOUS | Status: DC

## 2020-03-25 MED ORDER — LACTATED RINGERS IV SOLN
1000.0000 mL | INTRAVENOUS | Status: DC
  Administered 2020-03-25: 1000 mL via INTRAVENOUS

## 2020-03-25 MED ORDER — HYDROCODONE-ACETAMINOPHEN 10-325 MG PO TABS: 1.0000 | ORAL_TABLET | Freq: Four times a day (QID) | ORAL | 0 refills | Status: DC | PRN

## 2020-03-25 MED ORDER — LIDOCAINE HCL (PF) 1 % IJ SOLN
10.0000 mL | Freq: Once | INTRAMUSCULAR | Status: AC
  Administered 2020-03-25: 5 mL via SUBCUTANEOUS
  Filled 2020-03-25: qty 10

## 2020-03-25 MED ORDER — FENTANYL CITRATE (PF) 100 MCG/2ML IJ SOLN
100.0000 ug | Freq: Once | INTRAMUSCULAR | Status: AC
  Administered 2020-03-25: 75 ug via INTRAVENOUS
  Filled 2020-03-25: qty 2

## 2020-03-25 MED ORDER — HYDROCODONE-ACETAMINOPHEN 10-325 MG PO TABS: 1.0000 | ORAL_TABLET | Freq: Three times a day (TID) | ORAL | 0 refills | Status: DC

## 2020-03-25 MED ORDER — MIDAZOLAM HCL 2 MG/2ML IJ SOLN
5.0000 mg | Freq: Once | INTRAMUSCULAR | Status: AC
  Administered 2020-03-25: 3 mg via INTRAVENOUS

## 2020-03-25 MED ORDER — DEXAMETHASONE SODIUM PHOSPHATE 10 MG/ML IJ SOLN
10.0000 mg | Freq: Once | INTRAMUSCULAR | Status: AC
  Administered 2020-03-25: 10 mg
  Filled 2020-03-25: qty 1

## 2020-03-25 MED ORDER — ROPIVACAINE HCL 2 MG/ML IJ SOLN
10.0000 mL | Freq: Once | INTRAMUSCULAR | Status: AC
  Administered 2020-03-25: 10 mL via EPIDURAL
  Filled 2020-03-25: qty 10

## 2020-03-25 MED ORDER — TRIAMCINOLONE ACETONIDE 40 MG/ML IJ SUSP
40.0000 mg | Freq: Once | INTRAMUSCULAR | Status: DC
  Filled 2020-03-25: qty 1

## 2020-03-25 NOTE — Patient Instructions (Signed)

## 2020-03-25 NOTE — Progress Notes (Signed)
Nursing Pain Medication Assessment:  Safety precautions to be maintained throughout the outpatient stay will include: orient to surroundings, keep bed in low position, maintain call bell within reach at all times, provide assistance with transfer out of bed and ambulation.  Medication Inspection Compliance: Pill count conducted under aseptic conditions, in front of the patient. Neither the pills nor the bottle was removed from the patient's sight at any time. Once count was completed pills were immediately returned to the patient in their original bottle.  Medication: Hydrocodone/APAP Pill/Patch Count: 82 of 90 pills remain Pill/Patch Appearance: Markings consistent with prescribed medication Bottle Appearance: Standard pharmacy container. Clearly labeled. Filled Date: 70 / 09 / 2021 Last Medication intake:  TodaySafety precautions to be maintained throughout the outpatient stay will include: orient to surroundings, keep bed in low position, maintain call bell within reach at all times, provide assistance with transfer out of bed and ambulation.

## 2020-03-26 ENCOUNTER — Telehealth: Payer: Self-pay

## 2020-03-26 NOTE — Telephone Encounter (Signed)
Post procedure follow up.  Patient states his back is doing real good.

## 2020-03-29 NOTE — Progress Notes (Signed)
Subjective:  Patient ID: Ryan Price., male    DOB: 04/25/1961  Age: 59 y.o. MRN: [id number]  CC: Back Pain (entire back)   Procedure: Right side medial branch of the lumbar facet radiofrequency ablation at L3-4 L4-5 L5-S1 under fluoroscopic guidance with moderate sedation  HPI Ryan Price. presents for reevaluation.  He presents today for his radiofrequency ablation of the right lumbar facets.  He mentions that the quality characteristic and distribution of the pain as he is describing it today are stable in nature without change recently.  He responded favorably to a previous therapeutic lumbar facet block which knocked out approximately 75% 80% of his pain in the right lower back for 6 weeks or more.  He has had recurrence noted as a gradual recurrence in the same area of the right lower back which radiates into the right hip and upper leg posteriorly.  The pain is worse with prolonged standing and certain rotational movements.  He has some left-sided low back pain but this is more central to the left side without radiation at present.  His lower extremity strength and function is unchanged and he previously had sciatica symptoms which are now better following a series of epidural steroid injections.  Outpatient Medications Prior to Visit  Medication Sig Dispense Refill  . BYSTOLIC 10 MG tablet Take 10 mg by mouth daily.     . fluticasone (FLONASE) 50 MCG/ACT nasal spray Place 1-2 sprays into both nostrils daily as needed (sinus issues/runny nose).     Marland Kitchen lisinopril (PRINIVIL,ZESTRIL) 20 MG tablet Take 20 mg by mouth daily.    Marland Kitchen lovastatin (MEVACOR) 20 MG tablet Take 20 mg by mouth daily.     Marland Kitchen omeprazole (PRILOSEC) 40 MG capsule Take 40 mg by mouth daily before breakfast.     . SYMBICORT 160-4.5 MCG/ACT inhaler Inhale 1 puff into the lungs at bedtime.     Marland Kitchen HYDROcodone-acetaminophen (NORCO) 10-325 MG tablet Take 1 tablet by mouth every 6 (six) hours as needed for moderate pain  or severe pain. 90 tablet 0  . oxyCODONE (ROXICODONE) 5 MG immediate release tablet Take 1 tablet (5 mg total) by mouth every 8 (eight) hours as needed. (Patient not taking: Reported on 02/10/2020) 20 tablet 0   No facility-administered medications prior to visit.    Review of Systems CNS: No confusion or sedation Cardiac: No angina or palpitations GI: No abdominal pain or constipation Constitutional: No nausea vomiting fevers or chills  Objective:  BP (!) 148/100   Pulse (!) 36   Temp (!) 97.2 F (36.2 C)   Resp 16   Ht 6\' 3"  (1.905 m)   Wt 260 lb (117.9 kg)   SpO2 98%   BMI 32.50 kg/m    BP Readings from Last 3 Encounters:  03/25/20 (!) 148/100  02/10/20 (!) 97/59  12/25/19 (!) 118/95     Wt Readings from Last 3 Encounters:  03/25/20 260 lb (117.9 kg)  02/10/20 260 lb (117.9 kg)  12/25/19 255 lb (115.7 kg)     Physical Exam Pt is alert and oriented PERRL EOMI HEART IS RRR no murmur or rub LCTA no wheezing or rales MUSCULOSKELETAL reveals some paraspinous muscle tenderness but no overt trigger points.  He has pain with extension at the right side upon right lateral rotation.  This extends into the posterior right hip and posterior upper leg.  Labs  No results found for: HGBA1C Lab Results  Component Value Date  CREATININE 0.81 05/26/2015    -------------------------------------------------------------------------------------------------------------------- Lab Results  Component Value Date   WBC 13.6 (H) 05/26/2015   HGB 13.9 04/26/2016   HCT 48.0 05/26/2015   PLT 242 05/26/2015   GLUCOSE 117 (H) 05/26/2015   ALT 28 05/26/2015   AST 21 05/26/2015   NA 138 05/26/2015   K 4.1 05/26/2015   CL 112 (H) 05/26/2015   CREATININE 0.81 05/26/2015   BUN 13 05/26/2015   CO2 21 (L) 05/26/2015    --------------------------------------------------------------------------------------------------------------------- DG PAIN CLINIC C-ARM 1-60 MIN NO  REPORT  Result Date: 03/25/2020 Fluoro was used, but no Radiologist interpretation will be provided. Please refer to "NOTES" tab for provider progress note.    Assessment & Plan:   Ryan Price was seen today for back pain.  Diagnoses and all orders for this visit:  Lumbar facet arthropathy  Spondylosis of lumbar region without myelopathy or radiculopathy  DDD (degenerative disc disease), lumbar  Chronic, continuous use of opioids  Chronic pain syndrome  Chronic bilateral low back pain without sciatica  Other orders -     triamcinolone acetonide (KENALOG-40) injection 40 mg -     sodium chloride flush (NS) 0.9 % injection 10 mL -     ropivacaine (PF) 2 mg/mL (0.2%) (NAROPIN) injection 10 mL -     midazolam (VERSED) injection 5 mg -     lactated ringers infusion 1,000 mL -     fentaNYL (SUBLIMAZE) injection 100 mcg -     dexamethasone (DECADRON) injection 10 mg -     lidocaine (PF) (XYLOCAINE) 1 % injection 10 mL -     HYDROcodone-acetaminophen (NORCO) 10-325 MG tablet; Take 1 tablet by mouth every 6 (six) hours as needed for moderate pain or severe pain. -     HYDROcodone-acetaminophen (NORCO) 10-325 MG tablet; Take 1 tablet by mouth 3 (three) times daily.        ----------------------------------------------------------------------------------------------------------------------  Problem List Items Addressed This Visit      Unprioritized   Chronic low back pain   Relevant Medications   triamcinolone acetonide (KENALOG-40) injection 40 mg   HYDROcodone-acetaminophen (NORCO) 10-325 MG tablet (Start on 04/22/2020)   HYDROcodone-acetaminophen (NORCO) 10-325 MG tablet (Start on 05/22/2020)   Chronic pain   Relevant Medications   triamcinolone acetonide (KENALOG-40) injection 40 mg   HYDROcodone-acetaminophen (NORCO) 10-325 MG tablet (Start on 04/22/2020)   HYDROcodone-acetaminophen (NORCO) 10-325 MG tablet (Start on 05/22/2020)   Chronic, continuous use of opioids   DDD  (degenerative disc disease), lumbar   Relevant Medications   triamcinolone acetonide (KENALOG-40) injection 40 mg   HYDROcodone-acetaminophen (NORCO) 10-325 MG tablet (Start on 04/22/2020)   HYDROcodone-acetaminophen ([name]) 10-325 MG tablet (Start on 05/22/2020)    Other Visit Diagnoses    Lumbar facet arthropathy    -  Primary   Spondylosis of lumbar region without myelopathy or radiculopathy       Relevant Medications   triamcinolone acetonide (KENALOG-40) injection 40 mg   fentaNYL (SUBLIMAZE) injection 100 mcg (Completed)   dexamethasone (DECADRON) injection 10 mg (Completed)   HYDROcodone-acetaminophen (NORCO) 10-325 MG tablet (Start on 04/22/2020)   HYDROcodone-acetaminophen (NORCO) 10-325 MG tablet (Start on 05/22/2020)        ----------------------------------------------------------------------------------------------------------------------  1. Lumbar facet arthropathy We will proceed with radiofrequency ablation of the right lumbar facets today.  Of gone over the risks and benefits of the procedure with him in full detail and all questions were answered.  I would like him to continue with core stretching strengthening exercises and  efforts at weight loss.  We will schedule him for return to clinic in 2 months for reevaluation.  Should he have any problems in the meantime he is instructed to contact us for potential further evaluation.  2. Spondylosis of lumbar region without myelopathy or radiculopathy As above  3. DDD (degenerative disc disease), lumbar As above  4. Chronic, continuous use of opioids I have reviewed the [facility] practitioner database information and it is appropriate for refills today.  These will be dated for July and January 2022  5. Chronic pain syndrome As above  6. Chronic bilateral low back pain without sciatica Continue core stretching strengthening  exercises    ----------------------------------------------------------------------------------------------------------------------  I am having Ryan Price. start on HYDROcodone-acetaminophen. I am also having him maintain his lisinopril, Bystolic, fluticasone, lovastatin, omeprazole, Symbicort, oxyCODONE, and HYDROcodone-acetaminophen. We administered ropivacaine (PF) 2 mg/mL (0.2%), midazolam, lactated ringers, fentaNYL, dexamethasone, and lidocaine (PF).   Meds ordered this encounter  Medications  . triamcinolone acetonide (KENALOG-40) injection 40 mg  . sodium chloride flush (NS) 0.9 % injection 10 mL  . ropivacaine (PF) 2 mg/mL (0.2%) (NAROPIN) injection 10 mL  . midazolam (VERSED) injection 5 mg  . lactated ringers infusion 1,000 mL  . fentaNYL (SUBLIMAZE) injection 100 mcg  . dexamethasone (DECADRON) injection 10 mg  . lidocaine (PF) (XYLOCAINE) 1 % injection 10 mL  . HYDROcodone-acetaminophen (NORCO) 10-325 MG tablet    Sig: Take 1 tablet by mouth every 6 (six) hours as needed for moderate pain or severe pain.    Dispense:  90 tablet    Refill:  0  . HYDROcodone-acetaminophen (NORCO) 10-325 MG tablet    Sig: Take 1 tablet by mouth 3 (three) times daily.    Dispense:  90 tablet    Refill:  0   Patient's Medications  New Prescriptions   HYDROCODONE-ACETAMINOPHEN (NORCO) 10-325 MG TABLET    Take 1 tablet by mouth 3 (three) times daily.  Previous Medications   BYSTOLIC 10 MG TABLET    Take 10 mg by mouth daily.    FLUTICASONE (FLONASE) 50 MCG/ACT NASAL SPRAY    Place 1-2 sprays into both nostrils daily as needed (sinus issues/runny nose).    LISINOPRIL (PRINIVIL,ZESTRIL) 20 MG TABLET    Take 20 mg by mouth daily.   LOVASTATIN (MEVACOR) 20 MG TABLET    Take 20 mg by mouth daily.    OMEPRAZOLE (PRILOSEC) 40 MG CAPSULE    Take 40 mg by mouth daily before breakfast.    OXYCODONE (ROXICODONE) 5 MG IMMEDIATE RELEASE TABLET    Take 1 tablet (5 mg total) by mouth every 8  (eight) hours as needed.   SYMBICORT 160-4.5 MCG/ACT INHALER    Inhale 1 puff into the lungs at bedtime.   Modified Medications   Modified Medication Previous Medication   HYDROCODONE-ACETAMINOPHEN (NORCO) 10-325 MG TABLET HYDROcodone-acetaminophen (NORCO) 10-325 MG tablet      Take 1 tablet by mouth every 6 (six) hours as needed for moderate pain or severe pain.    Take 1 tablet by mouth every 6 (six) hours as needed for moderate pain or severe pain.  Discontinued Medications   No medications on file   ----------------------------------------------------------------------------------------------------------------------  Follow-up: Return in about 2 months (around 05/25/2020) for evaluation, med refill.  Procedure note. After informed consent was obtained with the risks and benefits reviewed the patient chose to pursue radiofrequency ablation of the after mentioned facet joints in the lumbar region. They were taken to the fluoroscopy  suite and placed in the prone position with an IV in place and we titrated Versed for sedation with  fentanyl as documented with vital signs stable throughout the procedure. Their back was prepped in the usual fashion with Betadine 3 and I identified areas overlying the L3-4, L4-5, L5-S1 facets on the left side. I advanced a 22-gauge radiofrequency 3-1/2 inch length RF needles in the usual fashion. These were advanced with the needle tip to lie at the [place] eye portion of the " Scotty dog". This was done after lidocaine 1.5% injected to the subcutaneous and fascia in each of the aforementioned levels. Needle confirmation was both in the AP and oblique and lateral view. Needle tip depth and positioning was confirmed. We proceeded with sensory interrogation which yielded appropriate reproduction of pain between 0.2 and 0.5 Volts. A normal motor stimulus was performed at each of the aforementioned levels at 2 times the sensory stimulus with no evidence of active stimulation to  the lower extremity based on clinical findings and based on patient history and response. At each level a Radiofrequency burn at [facility]  for 1 minutes was performed and tolerated well. Prior to the burn I injected 1 cc of 1% lidocaine and the RF needles were then injected after the burn with 2 cc of ropivacaine 0.2% mixed with 10 mg of triamcinolone and subsequently withdrawn. The patient was convalesced discharged home in stable condition for follow-up as mentioned.  Dr. Vashti Hey, M.D.   Molli Barrows, MD

## 2020-04-13 ENCOUNTER — Telehealth: Payer: Self-pay | Admitting: Anesthesiology

## 2020-04-13 NOTE — Telephone Encounter (Signed)
Patient forgot to change his pain meds from 4 times a day to 3 times a day and is now out of meds. Wants to know if he can have script sent in to last until his next refill is due.

## 2020-04-13 NOTE — Telephone Encounter (Signed)
Patient is due to have meds refilled on  04/22/20. I called him back to let him know that you were out of the office the remaining of week. He states that he continued to take 4 a day instead of 3 a day as ordered. "I did this out of habit". Is there anything you can do to help him.

## 2020-04-13 NOTE — Telephone Encounter (Signed)
Patient is due to refill on 04/22/20.

## 2020-04-19 MED ORDER — HYDROCODONE-ACETAMINOPHEN 10-325 MG PO TABS
1.0000 | ORAL_TABLET | Freq: Four times a day (QID) | ORAL | 0 refills | Status: DC | PRN
Start: 2020-04-19 — End: 2020-05-18

## 2020-04-19 NOTE — Addendum Note (Signed)
Addended by: Molli Barrows on: 04/19/2020 02:07 PM   Modules accepted: Orders

## 2020-05-18 ENCOUNTER — Ambulatory Visit: Payer: Medicare Other | Attending: Anesthesiology | Admitting: Anesthesiology

## 2020-05-18 ENCOUNTER — Other Ambulatory Visit: Payer: Self-pay

## 2020-05-18 ENCOUNTER — Encounter: Payer: Self-pay | Admitting: Anesthesiology

## 2020-05-18 DIAGNOSIS — M47816 Spondylosis without myelopathy or radiculopathy, lumbar region: Secondary | ICD-10-CM | POA: Diagnosis not present

## 2020-05-18 DIAGNOSIS — G8929 Other chronic pain: Secondary | ICD-10-CM

## 2020-05-18 DIAGNOSIS — G894 Chronic pain syndrome: Secondary | ICD-10-CM | POA: Diagnosis not present

## 2020-05-18 DIAGNOSIS — M5136 Other intervertebral disc degeneration, lumbar region: Secondary | ICD-10-CM | POA: Diagnosis not present

## 2020-05-18 DIAGNOSIS — M5431 Sciatica, right side: Secondary | ICD-10-CM

## 2020-05-18 DIAGNOSIS — M545 Low back pain, unspecified: Secondary | ICD-10-CM

## 2020-05-18 DIAGNOSIS — M5432 Sciatica, left side: Secondary | ICD-10-CM

## 2020-05-18 DIAGNOSIS — M542 Cervicalgia: Secondary | ICD-10-CM

## 2020-05-18 DIAGNOSIS — F1190 Opioid use, unspecified, uncomplicated: Secondary | ICD-10-CM | POA: Diagnosis not present

## 2020-05-18 DIAGNOSIS — M5412 Radiculopathy, cervical region: Secondary | ICD-10-CM

## 2020-05-18 DIAGNOSIS — M51369 Other intervertebral disc degeneration, lumbar region without mention of lumbar back pain or lower extremity pain: Secondary | ICD-10-CM

## 2020-05-18 MED ORDER — HYDROCODONE-ACETAMINOPHEN 10-325 MG PO TABS: 1.0000 | ORAL_TABLET | Freq: Four times a day (QID) | ORAL | 0 refills | Status: AC | PRN

## 2020-05-18 MED ORDER — HYDROCODONE-ACETAMINOPHEN 10-325 MG PO TABS
1.0000 | ORAL_TABLET | Freq: Four times a day (QID) | ORAL | 0 refills | Status: DC | PRN
Start: 2020-06-19 — End: 2020-07-16

## 2020-05-20 NOTE — Progress Notes (Signed)
Virtual Visit via Telephone Note  I connected with Ryan Price. on 05/20/20 at  1:00 PM EST by telephone and verified that I am speaking with the correct person using two identifiers.  Location: Patient: Home Provider: [facility] control center   I discussed the limitations, risks, security and privacy concerns of performing an evaluation and management service by telephone and the availability of in person appointments. I also discussed with the patient that there may be a patient responsible charge related to this service. The patient expressed understanding and agreed to proceed.   History of Present Illness: I spoke with [name] today via telephone for the virtual conference.  He was unable to do the video portion of this.  He states that he has been somewhat restricted from activity because of the COVID crisis and is laying low.  The quality characteristic and distribution of his back pain have been stable.  In the past he has had a right-sided facet block and this continues to work well for his lower right lumbar pain but he is experiencing more pain on the left side.  Despite efforts at conservative treatment with stretching strengthening physical therapy activity and medication management this pain has been very severe and continues to radiate into the left buttock hip and posterior leg.  The pain on the right side is better following his most recent right-sided facet.  He is not experiencing any sciatica-like symptoms.  His strength has been good bowel bladder functions been good and the medications he takes seem to work well for his pain giving him 75 to 80% relief but despite this he does continue to breakthrough with what he describes as severe left-sided lumbar pain.   Observations/Objective:  Current Outpatient Medications:  .  [START ON 06/19/2020] HYDROcodone-acetaminophen (NORCO) 10-325 MG tablet, Take 1 tablet by mouth every 6 (six) hours as needed for moderate pain or severe  pain., Disp: 100 tablet, Rfl: 0 .  BYSTOLIC 10 MG tablet, Take 10 mg by mouth daily. , Disp: , Rfl:  .  fluticasone (FLONASE) 50 MCG/ACT nasal spray, Place 1-2 sprays into both nostrils daily as needed (sinus issues/runny nose). , Disp: , Rfl:  .  HYDROcodone-acetaminophen (NORCO) 10-325 MG tablet, Take 1 tablet by mouth every 6 (six) hours as needed for moderate pain or severe pain., Disp: 100 tablet, Rfl: 0 .  lisinopril (PRINIVIL,ZESTRIL) 20 MG tablet, Take 20 mg by mouth daily., Disp: , Rfl:  .  lovastatin (MEVACOR) 20 MG tablet, Take 20 mg by mouth daily. , Disp: , Rfl:  .  omeprazole (PRILOSEC) 40 MG capsule, Take 40 mg by mouth daily before breakfast. , Disp: , Rfl:  .  oxyCODONE (ROXICODONE) 5 MG immediate release tablet, Take 1 tablet (5 mg total) by mouth every 8 (eight) hours as needed. (Patient not taking: Reported on 02/10/2020), Disp: 20 tablet, Rfl: 0 .  SYMBICORT 160-4.5 MCG/ACT inhaler, Inhale 1 puff into the lungs at bedtime. , Disp: , Rfl:   Assessment and Plan:  1. Lumbar facet arthropathy   2. Spondylosis of lumbar region without myelopathy or radiculopathy   3. DDD (degenerative disc disease), lumbar   4. Chronic, continuous use of opioids   5. Chronic pain syndrome   6. Chronic bilateral low back pain without sciatica   7. Chronic right-sided low back pain without sciatica   8. Degenerative disc disease, lumbar   9. Cervicalgia   10. Cervical radiculitis   11. Bilateral sciatica   Based on  her discussion today and per request of the patient I am scheduling him for February right-sided lumbar facet block.  Wanted to continue with core stretching strengthening activities and see if this does not help.  No other changes will be made in his pain management protocol and his medications will be refilled today upon review of the [facility] practitioner database information.  He does voice good relief with the medicines and no side effects.  Unfortunately he has failed more  conservative therapy and the pain in his lumbar region has been persistent.  He is to continue follow-up with his primary care physicians for his baseline medical care and return to clinic in February for facet block. Follow Up Instructions:    I discussed the assessment and treatment plan with the patient. The patient was provided an opportunity to ask questions and all were answered. The patient agreed with the plan and demonstrated an understanding of the instructions.   The patient was advised to call back or seek an in-person evaluation if the symptoms worsen or if the condition fails to improve as anticipated.  I provided 30 minutes of non-face-to-face time during this encounter.   Yevette Edwards, MD

## 2020-06-21 ENCOUNTER — Ambulatory Visit (HOSPITAL_BASED_OUTPATIENT_CLINIC_OR_DEPARTMENT_OTHER): Payer: Medicare Other | Admitting: Anesthesiology

## 2020-06-21 ENCOUNTER — Ambulatory Visit
Admission: RE | Admit: 2020-06-21 | Discharge: 2020-06-21 | Disposition: A | Discharge status: Home / Self Care | Payer: Medicare Other | Source: Ambulatory Visit | Attending: Anesthesiology | Admitting: Anesthesiology

## 2020-06-21 ENCOUNTER — Encounter: Payer: Self-pay | Admitting: Anesthesiology

## 2020-06-21 ENCOUNTER — Other Ambulatory Visit: Payer: Self-pay | Admitting: Anesthesiology

## 2020-06-21 ENCOUNTER — Other Ambulatory Visit: Payer: Self-pay

## 2020-06-21 VITALS — BP 92/50 | HR 84 | Temp 97.1°F | Resp 23 | Ht 74.0 in | Wt 266.0 lb

## 2020-06-21 DIAGNOSIS — R52 Pain, unspecified: Secondary | ICD-10-CM | POA: Insufficient documentation

## 2020-06-21 DIAGNOSIS — G8929 Other chronic pain: Secondary | ICD-10-CM

## 2020-06-21 DIAGNOSIS — G894 Chronic pain syndrome: Secondary | ICD-10-CM | POA: Insufficient documentation

## 2020-06-21 DIAGNOSIS — M47816 Spondylosis without myelopathy or radiculopathy, lumbar region: Secondary | ICD-10-CM | POA: Insufficient documentation

## 2020-06-21 DIAGNOSIS — F1190 Opioid use, unspecified, uncomplicated: Secondary | ICD-10-CM

## 2020-06-21 DIAGNOSIS — M5136 Other intervertebral disc degeneration, lumbar region: Secondary | ICD-10-CM | POA: Insufficient documentation

## 2020-06-21 DIAGNOSIS — M545 Low back pain, unspecified: Secondary | ICD-10-CM | POA: Insufficient documentation

## 2020-06-21 MED ORDER — MIDAZOLAM HCL 2 MG/2ML IJ SOLN
5.0000 mg | Freq: Once | INTRAMUSCULAR | Status: AC
  Administered 2020-06-21: 3 mg via INTRAVENOUS
  Filled 2020-06-21: qty 5

## 2020-06-21 MED ORDER — SODIUM CHLORIDE (PF) 0.9 % IJ SOLN
INTRAMUSCULAR | Status: AC
  Filled 2020-06-21: qty 10

## 2020-06-21 MED ORDER — LIDOCAINE HCL (PF) 1 % IJ SOLN
5.0000 mL | Freq: Once | INTRAMUSCULAR | Status: AC
  Administered 2020-06-21: 5 mL via SUBCUTANEOUS

## 2020-06-21 MED ORDER — SODIUM CHLORIDE 0.9% FLUSH
10.0000 mL | Freq: Once | INTRAVENOUS | Status: AC
  Administered 2020-06-21: 10 mL

## 2020-06-21 MED ORDER — LIDOCAINE HCL (PF) 1 % IJ SOLN
INTRAMUSCULAR | Status: AC
  Filled 2020-06-21: qty 10

## 2020-06-21 MED ORDER — ROPIVACAINE HCL 2 MG/ML IJ SOLN
INTRAMUSCULAR | Status: AC
  Filled 2020-06-21: qty 10

## 2020-06-21 MED ORDER — LACTATED RINGERS IV SOLN
1000.0000 mL | INTRAVENOUS | Status: DC
  Administered 2020-06-21: 1000 mL via INTRAVENOUS

## 2020-06-21 MED ORDER — TRIAMCINOLONE ACETONIDE 40 MG/ML IJ SUSP
40.0000 mg | Freq: Once | INTRAMUSCULAR | Status: AC
  Administered 2020-06-21: 40 mg

## 2020-06-21 MED ORDER — MIDAZOLAM HCL 5 MG/5ML IJ SOLN
INTRAMUSCULAR | Status: AC
  Filled 2020-06-21: qty 5

## 2020-06-21 MED ORDER — TRIAMCINOLONE ACETONIDE 40 MG/ML IJ SUSP
INTRAMUSCULAR | Status: AC
  Filled 2020-06-21: qty 1

## 2020-06-21 MED ORDER — ROPIVACAINE HCL 2 MG/ML IJ SOLN
10.0000 mL | Freq: Once | INTRAMUSCULAR | Status: AC
  Administered 2020-06-21: 10 mL via EPIDURAL

## 2020-06-21 NOTE — Patient Instructions (Signed)

## 2020-06-21 NOTE — Progress Notes (Signed)
Subjective:  Patient ID: Ryan Price., male    DOB: 06-24-1960  Age: 60 y.o. MRN: [id number]  CC: Back Pain (Low and left)   Procedure: Left side medial branch block to the lumbar facets at L2-3 L3-4 L4-5 and L5-S1 under fluoroscopic guidance and moderate sedation  HPI Ryan Price. presents for reevaluation.  He presents complaining of persistent low back pain with worsening left posterior low back pain greater than right lower back pain with radiation into the bilateral hips buttocks and left posterior leg.  He describes this pain as being similar to what he has experienced in the past mainly a gnawing aching pain worse with prolonged standing or any type of rotational or increase activity it goes into the left buttocks and left posterior leg.  He gets some of this in the right side and has had previous right side facet blocks for that and these are still working well.  He denies any change in lower extremity strength or function and has been taking his medications as prescribed and these continue to work well for him.  He gets good relief with these but describes a crescendo scenario where the pain has been more intense and the medications have been less beneficial.  In the past has had facet blocks with 75 to 80% relief lasting 2 to 3 months before he gets some gradual recurrence of pain.  He desires to proceed with a repeat facet block today.  No changes in the quality of his pain are noted no changes in strength or bowel or bladder function are noted.  Outpatient Medications Prior to Visit  Medication Sig Dispense Refill  . BYSTOLIC 10 MG tablet Take 10 mg by mouth daily.     . fluticasone (FLONASE) 50 MCG/ACT nasal spray Place 1-2 sprays into both nostrils daily as needed (sinus issues/runny nose).     Marland Kitchen HYDROcodone-acetaminophen (NORCO) 10-325 MG tablet Take 1 tablet by mouth every 6 (six) hours as needed for moderate pain or severe pain. 100 tablet 0  . lisinopril  (PRINIVIL,ZESTRIL) 20 MG tablet Take 20 mg by mouth daily.    Marland Kitchen lovastatin (MEVACOR) 20 MG tablet Take 20 mg by mouth daily.     Marland Kitchen omeprazole (PRILOSEC) 40 MG capsule Take 40 mg by mouth daily before breakfast.     . oxyCODONE (ROXICODONE) 5 MG immediate release tablet Take 1 tablet (5 mg total) by mouth every 8 (eight) hours as needed. (Patient not taking: No sig reported) 20 tablet 0  . SYMBICORT 160-4.5 MCG/ACT inhaler Inhale 1 puff into the lungs at bedtime.  (Patient not taking: Reported on 06/21/2020)     No facility-administered medications prior to visit.    Review of Systems CNS: No confusion or sedation Cardiac: No angina or palpitations GI: No abdominal pain or constipation Constitutional: No nausea vomiting fevers or chills  Objective:  BP 120/81   Pulse 84   Temp 99.2 F (37.3 C) (Oral)   Resp 16   Ht 6\' 2"  (1.88 m)   Wt 266 lb (120.7 kg)   SpO2 95%   BMI 34.15 kg/m    BP Readings from Last 3 Encounters:  06/21/20 120/81  03/25/20 (!) 148/100  02/10/20 (!) 97/59     Wt Readings from Last 3 Encounters:  06/21/20 266 lb (120.7 kg)  03/25/20 260 lb (117.9 kg)  02/10/20 260 lb (117.9 kg)     Physical Exam Pt is alert and oriented PERRL EOMI HEART IS  RRR no murmur or rub LCTA no wheezing or rales MUSCULOSKELETAL reveals some paraspinous muscle tenderness but no overt trigger points.  He has pain on standing with extension at the low back worse with left lateral rotation than right lateral rotation.  His strength appears to be at baseline with no change in muscle tone or bulk.  Labs  No results found for: HGBA1C Lab Results  Component Value Date   CREATININE 0.81 05/26/2015    -------------------------------------------------------------------------------------------------------------------- Lab Results  Component Value Date   WBC 13.6 (H) 05/26/2015   HGB 13.9 04/26/2016   HCT 48.0 05/26/2015   PLT 242 05/26/2015   GLUCOSE 117 (H) 05/26/2015    ALT 28 05/26/2015   AST 21 05/26/2015   NA 138 05/26/2015   K 4.1 05/26/2015   CL 112 (H) 05/26/2015   CREATININE 0.81 05/26/2015   BUN 13 05/26/2015   CO2 21 (L) 05/26/2015    --------------------------------------------------------------------------------------------------------------------- No results found.   Assessment & Plan:   Guerin was seen today for back pain.  Diagnoses and all orders for this visit:  Lumbar facet arthropathy  Spondylosis of lumbar region without myelopathy or radiculopathy  DDD (degenerative disc disease), lumbar  Chronic pain syndrome  Chronic, continuous use of opioids  Degenerative disc disease, lumbar  Chronic bilateral low back pain without sciatica  Other orders -     triamcinolone acetonide (KENALOG-40) injection 40 mg -     sodium chloride flush (NS) 0.9 % injection 10 mL -     ropivacaine (PF) 2 mg/mL (0.2%) (NAROPIN) injection 10 mL -     midazolam (VERSED) injection 5 mg -     lidocaine (PF) (XYLOCAINE) 1 % injection 5 mL -     lactated ringers infusion 1,000 mL        ----------------------------------------------------------------------------------------------------------------------  Problem List Items Addressed This Visit      Unprioritized   Chronic low back pain   Chronic pain   Chronic, continuous use of opioids   DDD (degenerative disc disease), lumbar   Degenerative disc disease, lumbar    Other Visit Diagnoses    Lumbar facet arthropathy    -  Primary   Spondylosis of lumbar 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 Lumbar facet arthropathy Based on his current situation and this recent exacerbation I talked to him about a therapeutic lumbar facet block.  He understands the  risks and benefits of the procedure.  All questions been answered.  He desires to proceed with this today.  I encouraged him to continue stretching strengthening exercises.  We will schedule him for 1 month routine return to clinic.  No other changes are initiated today.  2. Spondylosis of lumbar region without myelopathy or radiculopathy As above  3. DDD (degenerative disc disease), lumbar As above and continue efforts at weight loss as reviewed  4. Chronic pain syndrome I have reviewed the [facility] practitioner database information and is appropriate for refill.  5. Chronic, continuous use of opioids As above  6. Degenerative disc disease, lumbar   7. Chronic bilateral low back pain without sciatica     ----------------------------------------------------------------------------------------------------------------------  I am having Ryan Price. maintain his lisinopril, Bystolic, fluticasone, lovastatin, omeprazole, Symbicort, oxyCODONE, and HYDROcodone-acetaminophen. We administered triamcinolone acetonide, sodium chloride flush, ropivacaine (PF) 2 mg/mL (0.2%), midazolam, lidocaine (PF), and lactated ringers.  Meds ordered this encounter  Medications  . triamcinolone acetonide (KENALOG-40) injection 40 mg  . sodium chloride flush (NS) 0.9 % injection 10 mL  . ropivacaine (PF) 2 mg/mL (0.2%) (NAROPIN) injection 10 mL  . midazolam (VERSED) injection 5 mg  . lidocaine (PF) (XYLOCAINE) 1 % injection 5 mL  . lactated ringers infusion 1,000 mL   Patient's Medications  New Prescriptions   No medications on file  Previous Medications   BYSTOLIC 10 MG TABLET    Take 10 mg by mouth daily.    FLUTICASONE (FLONASE) 50 MCG/ACT NASAL SPRAY    Place 1-2 sprays into both nostrils daily as needed (sinus issues/runny nose).    HYDROCODONE-ACETAMINOPHEN (NORCO) 10-325 MG TABLET    Take 1 tablet by mouth every 6 (six) hours as needed for moderate pain or severe pain.    LISINOPRIL (PRINIVIL,ZESTRIL) 20 MG TABLET    Take 20 mg by mouth daily.   LOVASTATIN (MEVACOR) 20 MG TABLET    Take 20 mg by mouth daily.    OMEPRAZOLE (PRILOSEC) 40 MG CAPSULE    Take 40 mg by mouth daily before breakfast.    OXYCODONE (ROXICODONE) 5 MG IMMEDIATE RELEASE TABLET    Take 1 tablet (5 mg total) by mouth every 8 (eight) hours as needed.   SYMBICORT 160-4.5 MCG/ACT INHALER    Inhale 1 puff into the lungs at bedtime.   Modified Medications   No medications on file  Discontinued Medications   No medications on file   ----------------------------------------------------------------------------------------------------------------------  Follow-up: Return in about 1 month (around 07/19/2020) for evaluation, med refill.  Procedure: Left side lumbar medial branch block under fluoroscopic guidance at L2-3 L3-4 L4-5 L5-S1 with sedation Patient was taken to the fluoroscopy suite and placed in the prone position.Vital signs were stable throughout the procedure. The  overlying area of skin on the left side was prepped with Betadine 3 and strict aseptic technique was utilized throughout the procedure. Flouroscopy was used to  identify the areas overlying the aforementioned facets at  L2-3 L3-4  L4-5 and  L5-S1. 1% lidocaine 1 cc was infiltrated subcutaneously and into the fascia with a 25-gauge needle at each of these sites. I then advanced a 22-gauge 3-1/2 inch Quinckie needle with the needle tip to lie at the "[facility]" portion of the [employer] dog". There was negative aspiration for heme or CSF and  no paresthesia. I then injected 2 cc of ropivacaine 0.2% mixed with 10 mg of triamcinolone at each of the aforementioned sites. These needles were withdrawn.  The patient tolerated the procedure without any difficulty and was convalesced discharged home in stable condition for follow-up as mentioned  Molli Barrows, MD

## 2020-06-21 NOTE — Progress Notes (Signed)
Safety precautions to be maintained throughout the outpatient stay will include: orient to surroundings, keep bed in low position, maintain call bell within reach at all times, provide assistance with transfer out of bed and ambulation.  

## 2020-06-22 ENCOUNTER — Telehealth: Payer: Self-pay | Admitting: *Deleted

## 2020-06-22 NOTE — Telephone Encounter (Signed)
No problems post procedure. 

## 2020-07-16 ENCOUNTER — Encounter: Payer: Self-pay | Admitting: Anesthesiology

## 2020-07-16 ENCOUNTER — Other Ambulatory Visit: Payer: Self-pay

## 2020-07-16 ENCOUNTER — Ambulatory Visit: Payer: Medicare Other | Attending: Anesthesiology | Admitting: Anesthesiology

## 2020-07-16 DIAGNOSIS — F1190 Opioid use, unspecified, uncomplicated: Secondary | ICD-10-CM

## 2020-07-16 DIAGNOSIS — M5412 Radiculopathy, cervical region: Secondary | ICD-10-CM

## 2020-07-16 DIAGNOSIS — M47816 Spondylosis without myelopathy or radiculopathy, lumbar region: Secondary | ICD-10-CM

## 2020-07-16 DIAGNOSIS — M542 Cervicalgia: Secondary | ICD-10-CM

## 2020-07-16 DIAGNOSIS — G894 Chronic pain syndrome: Secondary | ICD-10-CM | POA: Diagnosis not present

## 2020-07-16 DIAGNOSIS — G8929 Other chronic pain: Secondary | ICD-10-CM

## 2020-07-16 DIAGNOSIS — M5136 Other intervertebral disc degeneration, lumbar region: Secondary | ICD-10-CM

## 2020-07-16 DIAGNOSIS — M545 Low back pain, unspecified: Secondary | ICD-10-CM

## 2020-07-16 MED ORDER — HYDROCODONE-ACETAMINOPHEN 10-325 MG PO TABS: 1.0000 | ORAL_TABLET | Freq: Four times a day (QID) | ORAL | 0 refills | Status: AC | PRN

## 2020-07-16 MED ORDER — HYDROCODONE-ACETAMINOPHEN 10-325 MG PO TABS: 1.0000 | ORAL_TABLET | Freq: Four times a day (QID) | ORAL | 0 refills | Status: DC | PRN

## 2020-07-16 NOTE — Progress Notes (Signed)
Virtual Visit via Telephone Note  I connected with Ryan Price. on 07/16/20 at  1:15 PM EST by telephone and verified that I am speaking with the correct person using two identifiers.  Location: Patient: Home Provider: Pain control center   I discussed the limitations, risks, security and privacy concerns of performing an evaluation and management service by telephone and the availability of in person appointments. I also discussed with the patient that there may be a patient responsible charge related to this service. The patient expressed understanding and agreed to proceed.   History of Present Illness: I spoke with Ryan Price today via telephone as he was unable to do the video portion of the virtual conference.  He reports that he still having considerable low back pain.  His last facet block gave him 75 to 90% relief in his low back pain for approximately 6 weeks before had some gradual recurrence of the same pain.  He reports that he is also had a previous radiofrequency which was of limited success.  He feels that the facet blocks keep his pain under good control and in addition to the medications he is doing well with that regimen.  He does get frequent breakthrough pain at night and feels that he was doing better on the 4 times daily dosing than the current dosing.  He tosses and turns and has recurrence of severe pain at night which is problematic for him and that his sleep is limited.  Otherwise he is in his usual state of health with no new changes in lower extremity strength function bowel or bladder function.  He is try to do his exercises as best possible and he is more successful at this he reports, when receiving the facet blocks.  He denies any calf cramping or numbness or tingling affecting the calves or feet or other sciatica-like symptoms.  His strength is been well preserved.  Observations/Objective:   Current Outpatient Medications:  .  [START ON 08/14/2020]  HYDROcodone-acetaminophen (NORCO) 10-325 MG tablet, Take 1 tablet by mouth every 6 (six) hours as needed for moderate pain or severe pain., Disp: 120 tablet, Rfl: 0 .  BYSTOLIC 10 MG tablet, Take 10 mg by mouth daily. , Disp: , Rfl:  .  fluticasone (FLONASE) 50 MCG/ACT nasal spray, Place 1-2 sprays into both nostrils daily as needed (sinus issues/runny nose). , Disp: , Rfl:  .  HYDROcodone-acetaminophen (NORCO) 10-325 MG tablet, Take 1 tablet by mouth every 6 (six) hours as needed for moderate pain or severe pain., Disp: 120 tablet, Rfl: 0 .  lisinopril (PRINIVIL,ZESTRIL) 20 MG tablet, Take 20 mg by mouth daily., Disp: , Rfl:  .  lovastatin (MEVACOR) 20 MG tablet, Take 20 mg by mouth daily. , Disp: , Rfl:  .  omeprazole (PRILOSEC) 40 MG capsule, Take 40 mg by mouth daily before breakfast. , Disp: , Rfl:  .  oxyCODONE (ROXICODONE) 5 MG immediate release tablet, Take 1 tablet (5 mg total) by mouth every 8 (eight) hours as needed. (Patient not taking: No sig reported), Disp: 20 tablet, Rfl: 0 .  SYMBICORT 160-4.5 MCG/ACT inhaler, Inhale 1 puff into the lungs at bedtime.  (Patient not taking: Reported on 06/21/2020), Disp: , Rfl:  Assessment and Plan: 1. Lumbar facet arthropathy   2. Spondylosis of lumbar region without myelopathy or radiculopathy   3. DDD (degenerative disc disease), lumbar   4. Chronic pain syndrome   5. Chronic, continuous use of opioids   6. Degenerative disc  disease, lumbar   7. Chronic bilateral low back pain without sciatica   8. Cervicalgia   9. Cervical radiculitis   Based on our discussion today I think it is appropriate to have him come to the clinic for repeat bilateral lumbar facet block in approximately 1 month.  I want him to continue with stretching strengthening exercises and efforts at weight loss.  We will keep his medications at 10 mg strength from increasing the frequency to 4 times daily dosing for 120 tablets.  This will be dated for today and April 2.  No other  changes will be initiated in his regimen.  I want him to continue follow-up with his primary care physicians for his baseline medical care.  Follow Up Instructions:    I discussed the assessment and treatment plan with the patient. The patient was provided an opportunity to ask questions and all were answered. The patient agreed with the plan and demonstrated an understanding of the instructions.   The patient was advised to call back or seek an in-person evaluation if the symptoms worsen or if the condition fails to improve as anticipated.  I provided 30 minutes of non-face-to-face time during this encounter.   Molli Barrows, MD

## 2020-08-16 ENCOUNTER — Ambulatory Visit: Payer: Medicare Other | Admitting: Anesthesiology

## 2020-08-27 ENCOUNTER — Other Ambulatory Visit: Payer: Self-pay | Admitting: Anesthesiology

## 2020-09-07 ENCOUNTER — Telehealth: Payer: Self-pay | Admitting: Anesthesiology

## 2020-09-07 NOTE — Telephone Encounter (Signed)
Patient has appt for procedure and med mgmt on 09-15-20. He will be out of meds on 5-2. Please ask dr Andree Elk if he can go ahead with a refill for this patient. I cannot get him in any earlier. Please call patient with solution. Thank you

## 2020-09-08 NOTE — Telephone Encounter (Signed)
Text Dr Andree Elk about this patient.

## 2020-09-09 MED ORDER — HYDROCODONE-ACETAMINOPHEN 10-325 MG PO TABS: 1.0000 | ORAL_TABLET | Freq: Four times a day (QID) | ORAL | 0 refills | Status: DC | PRN

## 2020-09-09 NOTE — Telephone Encounter (Signed)
Hydrocodone - apap 10-325 mg sent in to fill on 09/13/20

## 2020-09-09 NOTE — Telephone Encounter (Signed)
Patient aware of the Rx being sent in.

## 2020-09-09 NOTE — Addendum Note (Signed)
Addended by: Molli Barrows on: 09/09/2020 03:17 PM   Modules accepted: Orders

## 2020-09-13 ENCOUNTER — Other Ambulatory Visit: Payer: Self-pay | Admitting: Anesthesiology

## 2020-09-15 ENCOUNTER — Ambulatory Visit
Admission: RE | Admit: 2020-09-15 | Discharge: 2020-09-15 | Disposition: A | Discharge status: Home / Self Care | Payer: Medicare Other | Source: Ambulatory Visit | Attending: Anesthesiology | Admitting: Anesthesiology

## 2020-09-15 ENCOUNTER — Other Ambulatory Visit: Payer: Self-pay | Admitting: Anesthesiology

## 2020-09-15 ENCOUNTER — Ambulatory Visit (HOSPITAL_BASED_OUTPATIENT_CLINIC_OR_DEPARTMENT_OTHER): Payer: Medicare Other | Admitting: Anesthesiology

## 2020-09-15 ENCOUNTER — Encounter: Payer: Self-pay | Admitting: Anesthesiology

## 2020-09-15 ENCOUNTER — Other Ambulatory Visit: Payer: Self-pay

## 2020-09-15 VITALS — BP 108/69 | HR 74 | Temp 97.4°F | Resp 18 | Ht 75.0 in | Wt 262.0 lb

## 2020-09-15 DIAGNOSIS — R52 Pain, unspecified: Secondary | ICD-10-CM

## 2020-09-15 DIAGNOSIS — F1190 Opioid use, unspecified, uncomplicated: Secondary | ICD-10-CM | POA: Diagnosis present

## 2020-09-15 DIAGNOSIS — M542 Cervicalgia: Secondary | ICD-10-CM | POA: Insufficient documentation

## 2020-09-15 DIAGNOSIS — G894 Chronic pain syndrome: Secondary | ICD-10-CM | POA: Insufficient documentation

## 2020-09-15 DIAGNOSIS — M47816 Spondylosis without myelopathy or radiculopathy, lumbar region: Secondary | ICD-10-CM

## 2020-09-15 DIAGNOSIS — M5136 Other intervertebral disc degeneration, lumbar region: Secondary | ICD-10-CM | POA: Insufficient documentation

## 2020-09-15 MED ORDER — HYDROCODONE-ACETAMINOPHEN 10-325 MG PO TABS: 1.0000 | ORAL_TABLET | Freq: Four times a day (QID) | ORAL | 0 refills | Status: DC | PRN

## 2020-09-15 MED ORDER — LIDOCAINE HCL (PF) 1 % IJ SOLN
5.0000 mL | Freq: Once | INTRAMUSCULAR | Status: AC
  Administered 2020-09-15: 5 mL via SUBCUTANEOUS

## 2020-09-15 MED ORDER — TRIAMCINOLONE ACETONIDE 40 MG/ML IJ SUSP
40.0000 mg | Freq: Once | INTRAMUSCULAR | Status: AC
  Administered 2020-09-15: 40 mg

## 2020-09-15 MED ORDER — FENTANYL CITRATE (PF) 100 MCG/2ML IJ SOLN
100.0000 ug | Freq: Once | INTRAMUSCULAR | Status: AC
Start: 2020-09-15 — End: 2020-09-15
  Administered 2020-09-15: 100 ug via INTRAVENOUS

## 2020-09-15 MED ORDER — ROPIVACAINE HCL 2 MG/ML IJ SOLN
10.0000 mL | Freq: Once | INTRAMUSCULAR | Status: AC
  Administered 2020-09-15: 10 mL via EPIDURAL

## 2020-09-15 MED ORDER — MIDAZOLAM HCL 5 MG/5ML IJ SOLN
5.0000 mg | Freq: Once | INTRAMUSCULAR | Status: AC
  Administered 2020-09-15: 2 mg via INTRAVENOUS

## 2020-09-15 MED ORDER — FENTANYL CITRATE (PF) 100 MCG/2ML IJ SOLN
INTRAMUSCULAR | Status: AC
  Filled 2020-09-15: qty 2

## 2020-09-15 MED ORDER — LIDOCAINE HCL (PF) 1 % IJ SOLN
INTRAMUSCULAR | Status: AC
  Filled 2020-09-15: qty 10

## 2020-09-15 MED ORDER — MIDAZOLAM HCL 5 MG/5ML IJ SOLN
INTRAMUSCULAR | Status: AC
  Filled 2020-09-15: qty 5

## 2020-09-15 MED ORDER — ROPIVACAINE HCL 2 MG/ML IJ SOLN
INTRAMUSCULAR | Status: AC
  Filled 2020-09-15: qty 10

## 2020-09-15 MED ORDER — TRIAMCINOLONE ACETONIDE 40 MG/ML IJ SUSP
INTRAMUSCULAR | Status: AC
  Filled 2020-09-15: qty 1

## 2020-09-15 MED ORDER — SODIUM CHLORIDE (PF) 0.9 % IJ SOLN
INTRAMUSCULAR | Status: AC
  Filled 2020-09-15: qty 10

## 2020-09-15 MED ORDER — SODIUM CHLORIDE 0.9% FLUSH: 10.0000 mL | Freq: Once | INTRAVENOUS | Status: DC

## 2020-09-15 MED ORDER — HYDROCODONE-ACETAMINOPHEN 10-325 MG PO TABS: 1.0000 | ORAL_TABLET | Freq: Four times a day (QID) | ORAL | 0 refills | Status: AC | PRN

## 2020-09-15 MED ORDER — LACTATED RINGERS IV SOLN
1000.0000 mL | INTRAVENOUS | Status: DC
  Administered 2020-09-15: 1000 mL via INTRAVENOUS

## 2020-09-15 NOTE — Progress Notes (Signed)
Nursing Pain Medication Assessment:  Safety precautions to be maintained throughout the outpatient stay will include: orient to surroundings, keep bed in low position, maintain call bell within reach at all times, provide assistance with transfer out of bed and ambulation.  Medication Inspection Compliance: Pill count conducted under aseptic conditions, in front of the patient. Neither the pills nor the bottle was removed from the patient's sight at any time. Once count was completed pills were immediately returned to the patient in their original bottle.  Medication: Hydrocodone/APAP Pill/Patch Count: 112 of 120 pills remain Pill/Patch Appearance: Markings consistent with prescribed medication Bottle Appearance: Standard pharmacy container. Clearly labeled. Filled Date: 05 / 02 / 2022 Last Medication intake:  TodaySafety precautions to be maintained throughout the outpatient stay will include: orient to surroundings, keep bed in low position, maintain call bell within reach at all times, provide assistance with transfer out of bed and ambulation.

## 2020-09-15 NOTE — Progress Notes (Signed)
Subjective:  Patient ID: Ryan Price., male    DOB: May 18, 1960  Age: 60 y.o. MRN: [id number]  CC: Back Pain (low)   Procedure: Left L2-3 L3-4 L4-5 L5-S1 medial branch block to the after mentioned facets under fluoroscopic guidance with moderate sedation  HPI Ryan Price. presents for reevaluation.  Ryan Price continues to have severe left lower back pain with radiation into the left buttock and left posterior hip and posterior leg.  This presented several weeks ago when he was performing a task with a rotational motion involving his back.  He noticed sudden severe incapacitating lightninglike spasming in his left lower back.  It radiated into the buttocks and down his leg.  It is continued to be persistent and in the past he has had facet blocks for similar pain that generally give him 75 to 100% relief lasting 2 to 3 months or more.  Previously he has had facet blocks in February of this year and May and July of last year.  He has had previous radiofrequency with less significant relief.  He desires to proceed with a left side therapeutic facet block today.  No changes in lower extremity strength or function or bowel or bladder function noted at this time.  He is try to do his stretching strengthening exercises but these are limited secondary to the pain.  He takes his medications as prescribed and these continue to help and he derives good functional benefit but this pain has been overwhelming.  Outpatient Medications Prior to Visit  Medication Sig Dispense Refill  . BYSTOLIC 10 MG tablet Take 10 mg by mouth daily.     . fluticasone (FLONASE) 50 MCG/ACT nasal spray Place 1-2 sprays into both nostrils daily as needed (sinus issues/runny nose).     Marland Kitchen lisinopril (PRINIVIL,ZESTRIL) 20 MG tablet Take 20 mg by mouth daily.    Marland Kitchen lovastatin (MEVACOR) 20 MG tablet Take 20 mg by mouth daily.     Marland Kitchen omeprazole (PRILOSEC) 40 MG capsule Take 40 mg by mouth daily before breakfast.     .  HYDROcodone-acetaminophen (NORCO) 10-325 MG tablet Take 1 tablet by mouth every 6 (six) hours as needed for moderate pain or severe pain. 120 tablet 0  . oxyCODONE (ROXICODONE) 5 MG immediate release tablet Take 1 tablet (5 mg total) by mouth every 8 (eight) hours as needed. (Patient not taking: No sig reported) 20 tablet 0  . SYMBICORT 160-4.5 MCG/ACT inhaler Inhale 1 puff into the lungs at bedtime.  (Patient not taking: No sig reported)     No facility-administered medications prior to visit.    Review of Systems CNS: No confusion or sedation Cardiac: No angina or palpitations GI: No abdominal pain or constipation Constitutional: No nausea vomiting fevers or chills  Objective:  BP 109/75   Pulse 74 Comment: nsr  Temp 97.8 F (36.6 C)   Resp 18   Ht 6\' 3"  (1.905 m)   Wt 262 lb (118.8 kg)   SpO2 97%   BMI 32.75 kg/m    BP Readings from Last 3 Encounters:  09/15/20 109/75  06/21/20 (!) 92/50  03/25/20 (!) 148/100     Wt Readings from Last 3 Encounters:  09/15/20 262 lb (118.8 kg)  06/21/20 266 lb (120.7 kg)  03/25/20 260 lb (117.9 kg)     Physical Exam Pt is alert and oriented PERRL EOMI HEART IS RRR no murmur or rub LCTA no wheezing or rales MUSCULOSKELETAL reveals some paraspinous muscle tenderness in  the lumbar region worse on the left than right.  With the patient in the standing position and extension left lateral rotation it does reproduce his pain based on his description.'s less so with the right side.  He ambulates with a mildly antalgic gait and his muscle tone and bulk are at baseline.  Labs  No results found for: HGBA1C Lab Results  Component Value Date   CREATININE 0.81 05/26/2015    -------------------------------------------------------------------------------------------------------------------- Lab Results  Component Value Date   WBC 13.6 (H) 05/26/2015   HGB 13.9 04/26/2016   HCT 48.0 05/26/2015   PLT 242 05/26/2015   GLUCOSE 117 (H)  05/26/2015   ALT 28 05/26/2015   AST 21 05/26/2015   NA 138 05/26/2015   K 4.1 05/26/2015   CL 112 (H) 05/26/2015   CREATININE 0.81 05/26/2015   BUN 13 05/26/2015   CO2 21 (L) 05/26/2015    --------------------------------------------------------------------------------------------------------------------- DG PAIN CLINIC C-ARM 1-60 MIN NO REPORT  Result Date: 09/15/2020 Fluoro was used, but no Radiologist interpretation will be provided. Please refer to "NOTES" tab for provider progress note.    Assessment & Plan:   Ryan Price was seen today for back pain.  Diagnoses and all orders for this visit:  Lumbar facet arthropathy  Spondylosis of lumbar region without myelopathy or radiculopathy  DDD (degenerative disc disease), lumbar  Chronic pain syndrome -     ToxASSURE Select 13 (MW), Urine  Chronic, continuous use of opioids -     ToxASSURE Select 13 (MW), Urine  Cervicalgia  Other orders -     HYDROcodone-acetaminophen (NORCO) 10-325 MG tablet; Take 1 tablet by mouth every 6 (six) hours as needed for moderate pain or severe pain. -     HYDROcodone-acetaminophen (NORCO) 10-325 MG tablet; Take 1 tablet by mouth every 6 (six) hours as needed for moderate pain or severe pain. -     triamcinolone acetonide (KENALOG-40) injection 40 mg -     sodium chloride flush (NS) 0.9 % injection 10 mL -     ropivacaine (PF) 2 mg/mL (0.2%) (NAROPIN) injection 10 mL -     midazolam (VERSED) 5 MG/5ML injection 5 mg -     lidocaine (PF) (XYLOCAINE) 1 % injection 5 mL -     lactated ringers infusion 1,000 mL -     fentaNYL (SUBLIMAZE) injection 100 mcg        ----------------------------------------------------------------------------------------------------------------------  Problem List Items Addressed This Visit      Unprioritized   Chronic pain   Relevant Medications   HYDROcodone-acetaminophen (NORCO) 10-325 MG tablet (Start on 10/13/2020)   HYDROcodone-acetaminophen (NORCO)  10-325 MG tablet (Start on 11/12/2020)   Other Relevant Orders   ToxASSURE Select 13 (MW), Urine   Chronic, continuous use of opioids   Relevant Orders   ToxASSURE Select 13 (MW), Urine   DDD (degenerative disc disease), lumbar   Relevant Medications   HYDROcodone-acetaminophen (NORCO) 10-325 MG tablet (Start on 10/13/2020)   HYDROcodone-acetaminophen (NORCO) 10-325 MG tablet (Start on 11/12/2020)    Other Visit Diagnoses    Lumbar facet arthropathy    -  Primary   Spondylosis of lumbar region without myelopathy or radiculopathy       Relevant Medications   HYDROcodone-acetaminophen (NORCO) 10-325 MG tablet (Start on 10/13/2020)   HYDROcodone-acetaminophen (NORCO) 10-325 MG tablet (Start on 11/12/2020)   triamcinolone acetonide (KENALOG-40) injection 40 mg (Completed)   fentaNYL (SUBLIMAZE) injection 100 mcg (Completed)   Cervicalgia            ----------------------------------------------------------------------------------------------------------------------  1. Lumbar facet arthropathy We will proceed with therapeutic lumbar facet block today.  Of gone over the risks and benefits of the procedure with him in full detail and all questions are answered.  I want him to continue with his core stretching strengthening exercises and we talked about several options for therapeutic release of the facets.  We will schedule him for return to clinic in 2 months.  2. Spondylosis of lumbar region without myelopathy or radiculopathy As above  3. DDD (degenerative disc disease), lumbar Continue core stretching strengthening exercises.  4. Chronic pain syndrome As above and I have reviewed the [facility] practitioner database information and it is appropriate for refills on his opioid medications from May and June and July - ToxASSURE Select 13 (MW), Urine  5. Chronic, continuous use of opioids As above - ToxASSURE Select 13 (MW), Urine  6. Cervicalgia Continue core stretching  strengthening    ----------------------------------------------------------------------------------------------------------------------  I am having Ryan Price. start on HYDROcodone-acetaminophen. I am also having him maintain his lisinopril, Bystolic, fluticasone, lovastatin, omeprazole, Symbicort, oxyCODONE, and HYDROcodone-acetaminophen. We administered triamcinolone acetonide, ropivacaine (PF) 2 mg/mL (0.2%), midazolam, lidocaine (PF), lactated ringers, and fentaNYL.   Meds ordered this encounter  Medications  . HYDROcodone-acetaminophen (NORCO) 10-325 MG tablet    Sig: Take 1 tablet by mouth every 6 (six) hours as needed for moderate pain or severe pain.    Dispense:  120 tablet    Refill:  0  . HYDROcodone-acetaminophen (NORCO) 10-325 MG tablet    Sig: Take 1 tablet by mouth every 6 (six) hours as needed for moderate pain or severe pain.    Dispense:  120 tablet    Refill:  0  . triamcinolone acetonide (KENALOG-40) injection 40 mg  . sodium chloride flush (NS) 0.9 % injection 10 mL  . ropivacaine (PF) 2 mg/mL (0.2%) (NAROPIN) injection 10 mL  . midazolam (VERSED) 5 MG/5ML injection 5 mg  . lidocaine (PF) (XYLOCAINE) 1 % injection 5 mL  . lactated ringers infusion 1,000 mL  . fentaNYL (SUBLIMAZE) injection 100 mcg   Patient's Medications  New Prescriptions   HYDROCODONE-ACETAMINOPHEN (NORCO) 10-325 MG TABLET    Take 1 tablet by mouth every 6 (six) hours as needed for moderate pain or severe pain.  Previous Medications   BYSTOLIC 10 MG TABLET    Take 10 mg by mouth daily.    FLUTICASONE (FLONASE) 50 MCG/ACT NASAL SPRAY    Place 1-2 sprays into both nostrils daily as needed (sinus issues/runny nose).    LISINOPRIL (PRINIVIL,ZESTRIL) 20 MG TABLET    Take 20 mg by mouth daily.   LOVASTATIN (MEVACOR) 20 MG TABLET    Take 20 mg by mouth daily.    OMEPRAZOLE (PRILOSEC) 40 MG CAPSULE    Take 40 mg by mouth daily before breakfast.    OXYCODONE (ROXICODONE) 5 MG IMMEDIATE  RELEASE TABLET    Take 1 tablet (5 mg total) by mouth every 8 (eight) hours as needed.   SYMBICORT 160-4.5 MCG/ACT INHALER    Inhale 1 puff into the lungs at bedtime.   Modified Medications   Modified Medication Previous Medication   HYDROCODONE-ACETAMINOPHEN (NORCO) 10-325 MG TABLET HYDROcodone-acetaminophen (NORCO) 10-325 MG tablet      Take 1 tablet by mouth every 6 (six) hours as needed for moderate pain or severe pain.    Take 1 tablet by mouth every 6 (six) hours as needed for moderate pain or severe pain.  Discontinued Medications   No medications  on file   ----------------------------------------------------------------------------------------------------------------------  Follow-up: Return for med refill, evaluation.  Procedure: Left side lumbar medial branch block under fluoroscopic guidance at L2-3 L3-4 L4-5 L5-S1 with sedation Patient was taken to the fluoroscopy suite and placed in the prone position.Vital signs were stable throughout the procedure. The  overlying area of skin on the left side was prepped with Betadine 3 and strict aseptic technique was utilized throughout the procedure. Flouroscopy was used to  identify the areas overlying the aforementioned facets at  L2-3 L3-4  L4-5 and  L5-S1. 1% lidocaine 1 cc was infiltrated subcutaneously and into the fascia with a 25-gauge needle at each of these sites. I then advanced a 22-gauge 3-1/2 inch Quinckie needle with the needle tip to lie at the "[facility]" portion of the [employer] dog". There was negative aspiration for heme or CSF and  no paresthesia. I then injected 2 cc of ropivacaine 0.2% mixed with 10 mg of triamcinolone at each of the aforementioned sites. These needles were withdrawn.  The patient tolerated the procedure without any difficulty and was convalesced discharged home in stable condition for follow-up as mentioned  Molli Barrows, MD

## 2020-09-15 NOTE — Patient Instructions (Signed)

## 2020-09-16 ENCOUNTER — Telehealth: Payer: Self-pay | Admitting: *Deleted

## 2020-09-16 NOTE — Telephone Encounter (Signed)
Denies any post procedure issues. 

## 2020-09-21 LAB — TOXASSURE SELECT 13 (MW), URINE

## 2020-09-28 ENCOUNTER — Ambulatory Visit: Payer: Medicare Other | Admitting: Student in an Organized Health Care Education/Training Program

## 2020-10-27 ENCOUNTER — Ambulatory Visit: Payer: Medicare Other | Attending: Anesthesiology | Admitting: Anesthesiology

## 2020-10-27 ENCOUNTER — Other Ambulatory Visit: Payer: Self-pay

## 2020-10-27 ENCOUNTER — Encounter: Payer: Self-pay | Admitting: Anesthesiology

## 2020-10-27 DIAGNOSIS — M5136 Other intervertebral disc degeneration, lumbar region: Secondary | ICD-10-CM | POA: Diagnosis not present

## 2020-10-27 DIAGNOSIS — F1190 Opioid use, unspecified, uncomplicated: Secondary | ICD-10-CM

## 2020-10-27 DIAGNOSIS — M47816 Spondylosis without myelopathy or radiculopathy, lumbar region: Secondary | ICD-10-CM

## 2020-10-27 DIAGNOSIS — G894 Chronic pain syndrome: Secondary | ICD-10-CM

## 2020-10-27 DIAGNOSIS — M545 Low back pain, unspecified: Secondary | ICD-10-CM

## 2020-10-27 DIAGNOSIS — M542 Cervicalgia: Secondary | ICD-10-CM

## 2020-10-27 DIAGNOSIS — G8929 Other chronic pain: Secondary | ICD-10-CM

## 2020-10-27 MED ORDER — HYDROCODONE-ACETAMINOPHEN 10-325 MG PO TABS: 1.0000 | ORAL_TABLET | Freq: Four times a day (QID) | ORAL | 0 refills | Status: AC | PRN

## 2020-10-27 MED ORDER — HYDROCODONE-ACETAMINOPHEN 10-325 MG PO TABS: 1.0000 | ORAL_TABLET | Freq: Four times a day (QID) | ORAL | 0 refills | Status: DC | PRN

## 2020-11-01 NOTE — Progress Notes (Signed)
Virtual Visit via Telephone Note  I connected with Ryan Price. on 11/01/20 at 10:30 AM EDT by telephone and verified that I am speaking with the correct person using two identifiers.  Location: Patient: Home Provider: [facility] control center   I discussed the limitations, risks, security and privacy concerns of performing an evaluation and management service by telephone and the availability of in person appointments. I also discussed with the patient that there may be a patient responsible charge related to this service. The patient expressed understanding and agreed to proceed.   History of Present Illness: I spoke with Ryan Price today via telephone as he was unable to perform the video portion of the virtual conference.  He reports that he has had a lot of facet type pain.  This has been worse on the on the left side but has been on both sides in general.  He has responded favorably to facet blocks for therapeutic purpose in the past.  He is also had a previous radiofrequency unfortunately this gave him limited success.  He reports that he has had the best response from the intermittent therapeutic facet blocks.  He generally goes about 2 to 3 months with 75% improvement 100% for about 2 months.  He is doing his physical therapy exercises but unfortunately these have failed to gain him any significant improvement.  The medications help and keep the pain under reasonable control but the most effective therapy has been the facet blocks.  The quality characteristic and distribution of the pain has been stable with no change in lower extremity strength or function of bowel or bladder function.  He continues to derive good functional benefit from the medication without side effect.  Review of systems General: No fevers chills nausea Cardiac: No angina or palpitation Pulmonary: No dyspnea or shortness of breath Psych: Stable GI: No constipation or diarrhea    Observations/Objective:   Current Outpatient Medications:    [START ON 12/11/2020] HYDROcodone-acetaminophen (NORCO) 10-325 MG tablet, Take 1 tablet by mouth every 6 (six) hours as needed for moderate pain or severe pain., Disp: 120 tablet, Rfl: 0   BYSTOLIC 10 MG tablet, Take 10 mg by mouth daily. , Disp: , Rfl:    fluticasone (FLONASE) 50 MCG/ACT nasal spray, Place 1-2 sprays into both nostrils daily as needed (sinus issues/runny nose). , Disp: , Rfl:    [START ON 11/12/2020] HYDROcodone-acetaminophen (NORCO) 10-325 MG tablet, Take 1 tablet by mouth every 6 (six) hours as needed for moderate pain or severe pain., Disp: 120 tablet, Rfl: 0   [START ON 11/12/2020] HYDROcodone-acetaminophen (NORCO) 10-325 MG tablet, Take 1 tablet by mouth every 6 (six) hours as needed for moderate pain or severe pain., Disp: 120 tablet, Rfl: 0   lisinopril (PRINIVIL,ZESTRIL) 20 MG tablet, Take 20 mg by mouth daily., Disp: , Rfl:    lovastatin (MEVACOR) 20 MG tablet, Take 20 mg by mouth daily. , Disp: , Rfl:    omeprazole (PRILOSEC) 40 MG capsule, Take 40 mg by mouth daily before breakfast. , Disp: , Rfl:    oxyCODONE (ROXICODONE) 5 MG immediate release tablet, Take 1 tablet (5 mg total) by mouth every 8 (eight) hours as needed. (Patient not taking: No sig reported), Disp: 20 tablet, Rfl: 0   SYMBICORT 160-4.5 MCG/ACT inhaler, Inhale 1 puff into the lungs at bedtime.  (Patient not taking: No sig reported), Disp: , Rfl:   Assessment and Plan: 1. Lumbar facet arthropathy   2. Spondylosis of lumbar region  without myelopathy or radiculopathy   3. DDD (degenerative disc disease), lumbar   4. Chronic pain syndrome   5. Chronic, continuous use of opioids   6. Cervicalgia   7. Degenerative disc disease, lumbar   8. Chronic bilateral low back pain without sciatica   As reviewed today I think he would be appropriate to proceed with a repeat therapeutic lumbar facet block bilaterally.  We discussed the risk and benefits of the  procedure with him in full detail.  He has responded favorably in the past generally getting 100% relief for almost 2 months following procedure.  Want him to continue efforts at weight loss stretching strengthening and we will make no further changes in his medication management.  We will continue this and I have reviewed the [facility] practitioner database information and it is appropriate for refill for July 1 and July 31.  Continue follow-up with his primary care physicians for baseline medical care.  Follow Up Instructions:    I discussed the assessment and treatment plan with the patient. The patient was provided an opportunity to ask questions and all were answered. The patient agreed with the plan and demonstrated an understanding of the instructions.   The patient was advised to call back or seek an in-person evaluation if the symptoms worsen or if the condition fails to improve as anticipated.  I provided 30 minutes of non-face-to-face time during this encounter.   Molli Barrows, MD

## 2020-12-27 ENCOUNTER — Ambulatory Visit
Admission: RE | Admit: 2020-12-27 | Discharge: 2020-12-27 | Disposition: A | Discharge status: Home / Self Care | Payer: Medicare Other | Source: Ambulatory Visit | Attending: Anesthesiology | Admitting: Anesthesiology

## 2020-12-27 ENCOUNTER — Encounter: Payer: Self-pay | Admitting: Anesthesiology

## 2020-12-27 ENCOUNTER — Ambulatory Visit (HOSPITAL_BASED_OUTPATIENT_CLINIC_OR_DEPARTMENT_OTHER): Payer: Medicare Other | Admitting: Anesthesiology

## 2020-12-27 ENCOUNTER — Other Ambulatory Visit: Payer: Self-pay

## 2020-12-27 ENCOUNTER — Other Ambulatory Visit: Payer: Self-pay | Admitting: Anesthesiology

## 2020-12-27 VITALS — BP 123/74 | HR 84 | Temp 97.8°F | Resp 14 | Ht 75.0 in | Wt 272.0 lb

## 2020-12-27 DIAGNOSIS — G894 Chronic pain syndrome: Secondary | ICD-10-CM

## 2020-12-27 DIAGNOSIS — R52 Pain, unspecified: Secondary | ICD-10-CM | POA: Insufficient documentation

## 2020-12-27 DIAGNOSIS — M47816 Spondylosis without myelopathy or radiculopathy, lumbar region: Secondary | ICD-10-CM | POA: Diagnosis present

## 2020-12-27 DIAGNOSIS — M542 Cervicalgia: Secondary | ICD-10-CM | POA: Diagnosis present

## 2020-12-27 DIAGNOSIS — F1190 Opioid use, unspecified, uncomplicated: Secondary | ICD-10-CM

## 2020-12-27 DIAGNOSIS — M5136 Other intervertebral disc degeneration, lumbar region: Secondary | ICD-10-CM | POA: Diagnosis present

## 2020-12-27 DIAGNOSIS — M5412 Radiculopathy, cervical region: Secondary | ICD-10-CM | POA: Diagnosis present

## 2020-12-27 MED ORDER — LIDOCAINE HCL (PF) 1 % IJ SOLN
10.0000 mL | Freq: Once | INTRAMUSCULAR | Status: AC
  Administered 2020-12-27: 10 mL via SUBCUTANEOUS

## 2020-12-27 MED ORDER — HYDROCODONE-ACETAMINOPHEN 10-325 MG PO TABS: 1.0000 | ORAL_TABLET | Freq: Four times a day (QID) | ORAL | 0 refills | Status: AC | PRN

## 2020-12-27 MED ORDER — LIDOCAINE HCL (PF) 1 % IJ SOLN
INTRAMUSCULAR | Status: AC
  Filled 2020-12-27: qty 10

## 2020-12-27 MED ORDER — TRIAMCINOLONE ACETONIDE 40 MG/ML IJ SUSP
80.0000 mg | Freq: Once | INTRAMUSCULAR | Status: AC
  Administered 2020-12-27: 80 mg

## 2020-12-27 MED ORDER — LACTATED RINGERS IV SOLN
1000.0000 mL | INTRAVENOUS | Status: DC
  Administered 2020-12-27: 1000 mL via INTRAVENOUS

## 2020-12-27 MED ORDER — ROPIVACAINE HCL 2 MG/ML IJ SOLN
INTRAMUSCULAR | Status: AC
  Filled 2020-12-27: qty 20

## 2020-12-27 MED ORDER — ROPIVACAINE HCL 2 MG/ML IJ SOLN
20.0000 mL | Freq: Once | INTRAMUSCULAR | Status: AC
  Administered 2020-12-27: 20 mL via EPIDURAL

## 2020-12-27 MED ORDER — SODIUM CHLORIDE 0.9% FLUSH: 10.0000 mL | Freq: Once | INTRAVENOUS | Status: DC

## 2020-12-27 MED ORDER — TRIAMCINOLONE ACETONIDE 40 MG/ML IJ SUSP
INTRAMUSCULAR | Status: AC
  Filled 2020-12-27: qty 2

## 2020-12-27 MED ORDER — MIDAZOLAM HCL 5 MG/5ML IJ SOLN
INTRAMUSCULAR | Status: AC
  Filled 2020-12-27: qty 5

## 2020-12-27 MED ORDER — MIDAZOLAM HCL 2 MG/2ML IJ SOLN
5.0000 mg | Freq: Once | INTRAMUSCULAR | Status: AC
  Administered 2020-12-27: 2 mg via INTRAVENOUS
  Filled 2020-12-27: qty 5

## 2020-12-27 NOTE — Progress Notes (Signed)
Subjective:  Patient ID: Ryan Schwalbe., male    DOB: November 19, 1960  Age: 60 y.o. MRN: [id number]  CC: Back Pain (Lower left)   Procedure: Bilateral L3-4 L4-5 L5-S1 medial branch block to the after mentioned lumbar facets under fluoroscopic guidance with moderate sedation   HPI Ryan Price. presents for reevaluation.  Ryan Price had been doing well following his most recent facet block back in May.  He sustained a fall and this aggravated his back to the point where he is now having low back pain with bilateral hip and buttock pain similar to what he has had in the past.  He generally gets 75% improvement in his low back pain and leg pain following the facet injections and these last for 2 to 3 months before he gets return of similar pain.  He desires to proceed with a repeat facet injection today.  No changes in lower extremity strength or function are noted.  Bowel bladder function have been okay.  He is getting some sciatica calf cramping he reports.  This is worse when he has been standing for prolonged period of time or laying in bed at night.  Otherwise he is taking his pain medications without difficulty and getting good relief from these.  He rates his pain relief but does not 50% to 70% lasting 4 to 6 hours.  No side effects are reported with these.  Outpatient Medications Prior to Visit  Medication Sig Dispense Refill   BYSTOLIC 10 MG tablet Take 10 mg by mouth daily.      fluticasone (FLONASE) 50 MCG/ACT nasal spray Place 1-2 sprays into both nostrils daily as needed (sinus issues/runny nose).      lisinopril (PRINIVIL,ZESTRIL) 20 MG tablet Take 20 mg by mouth daily.     lovastatin (MEVACOR) 20 MG tablet Take 20 mg by mouth daily.      omeprazole (PRILOSEC) 40 MG capsule Take 40 mg by mouth daily before breakfast.      HYDROcodone-acetaminophen (NORCO) 10-325 MG tablet Take 1 tablet by mouth every 6 (six) hours as needed for moderate pain or severe pain. 120 tablet 0    SYMBICORT 160-4.5 MCG/ACT inhaler Inhale 1 puff into the lungs at bedtime.  (Patient not taking: No sig reported)     No facility-administered medications prior to visit.    Review of Systems CNS: No confusion or sedation Cardiac: No angina or palpitations GI: No abdominal pain or constipation Constitutional: No nausea vomiting fevers or chills  Objective:  BP 123/74   Pulse 84   Temp 97.8 F (36.6 C) (Temporal)   Resp 14   Ht '6\' 3"'$  (1.905 m)   Wt 272 lb (123.4 kg)   SpO2 98%   BMI 34.00 kg/m    BP Readings from Last 3 Encounters:  12/27/20 123/74  09/15/20 108/69  06/21/20 (!) 92/50     Wt Readings from Last 3 Encounters:  12/27/20 272 lb (123.4 kg)  09/15/20 262 lb (118.8 kg)  06/21/20 266 lb (120.7 kg)     Physical Exam Pt is alert and oriented PERRL EOMI HEART IS RRR no murmur or rub LCTA no wheezing or rales MUSCULOSKELETAL reveals some paraspinous muscle tenderness but no overt trigger points.  He does have pain with right and left lateral rotation in the extended position while standing in the lumbar region.  His strength is at baseline he has an antalgic gait but his muscle tone and bulk is at baseline as well.  Labs  No results found for: HGBA1C Lab Results  Component Value Date   CREATININE 0.81 05/26/2015    -------------------------------------------------------------------------------------------------------------------- Lab Results  Component Value Date   WBC 13.6 (H) 05/26/2015   HGB 13.9 04/26/2016   HCT 48.0 05/26/2015   PLT 242 05/26/2015   GLUCOSE 117 (H) 05/26/2015   ALT 28 05/26/2015   AST 21 05/26/2015   NA 138 05/26/2015   K 4.1 05/26/2015   CL 112 (H) 05/26/2015   CREATININE 0.81 05/26/2015   BUN 13 05/26/2015   CO2 21 (L) 05/26/2015    --------------------------------------------------------------------------------------------------------------------- DG PAIN CLINIC C-ARM 1-60 MIN NO REPORT  Result Date:  12/27/2020 Fluoro was used, but no Radiologist interpretation will be provided. Please refer to "NOTES" tab for provider progress note.    Assessment & Plan:   Ryan Price was seen today for back pain.  Diagnoses and all orders for this visit:  Lumbar facet arthropathy  Spondylosis of lumbar region without myelopathy or radiculopathy  DDD (degenerative disc disease), lumbar  Chronic pain syndrome  Chronic, continuous use of opioids  Cervicalgia  Degenerative disc disease, lumbar  Cervical radiculitis  Other orders -     triamcinolone acetonide (KENALOG-40) injection 80 mg -     sodium chloride flush (NS) 0.9 % injection 10 mL -     ropivacaine (PF) 2 mg/mL (0.2%) (NAROPIN) injection 20 mL -     midazolam (VERSED) injection 5 mg -     lidocaine (PF) (XYLOCAINE) 1 % injection 10 mL -     lactated ringers infusion 1,000 mL -     HYDROcodone-acetaminophen (NORCO) 10-325 MG tablet; Take 1 tablet by mouth every 6 (six) hours as needed for moderate pain or severe pain. -     HYDROcodone-acetaminophen (NORCO) 10-325 MG tablet; Take 1 tablet by mouth every 6 (six) hours as needed for moderate pain or severe pain.        ----------------------------------------------------------------------------------------------------------------------  Problem List Items Addressed This Visit       Unprioritized   Cervical radiculitis   Chronic pain   Relevant Medications   HYDROcodone-acetaminophen (NORCO) 10-325 MG tablet (Start on 01/04/2021)   HYDROcodone-acetaminophen (NORCO) 10-325 MG tablet (Start on 02/03/2021)   Chronic, continuous use of opioids   DDD (degenerative disc disease), lumbar   Relevant Medications   HYDROcodone-acetaminophen (NORCO) 10-325 MG tablet (Start on 01/04/2021)   HYDROcodone-acetaminophen (NORCO) 10-325 MG tablet (Start on 02/03/2021)   Degenerative disc disease, lumbar   Relevant Medications   HYDROcodone-acetaminophen (NORCO) 10-325 MG tablet (Start on  01/04/2021)   HYDROcodone-acetaminophen (NORCO) 10-325 MG tablet (Start on 02/03/2021)   Other Visit Diagnoses     Lumbar facet arthropathy    -  Primary   Spondylosis of lumbar region without myelopathy or radiculopathy       Relevant Medications   triamcinolone acetonide (KENALOG-40) injection 80 mg (Completed)   HYDROcodone-acetaminophen (NORCO) 10-325 MG tablet (Start on 01/04/2021)   HYDROcodone-acetaminophen (NORCO) 10-325 MG tablet (Start on 02/03/2021)   Cervicalgia             ----------------------------------------------------------------------------------------------------------------------  1. Lumbar facet arthropathy We will proceed with a repeat therapeutic lumbar facet block today.  We gone over the risks and benefits of this in full detail all questions are answered.  I want her to continue with efforts at core stretching strengthening as previously requested.  2. Spondylosis of lumbar region without myelopathy or radiculopathy As above  3. DDD (degenerative disc disease), lumbar Will schedule return to clinic  in 2 months.  We may consider an epidural steroid injection to help with some of the sciatica symptoms as reviewed with him today.  4. Chronic pain syndrome We will continue with her current pain medicine regimen.  Refills will be given for the August 23 and September 22 dates.  I have reviewed the [facility] practitioner database information and it is appropriate.  5. Chronic, continuous use of opioids As above  6. Cervicalgia   7. Degenerative disc disease, lumbar   8. Cervical radiculitis     ----------------------------------------------------------------------------------------------------------------------  I am having Ryan Schwalbe. start on HYDROcodone-acetaminophen. I am also having him maintain his lisinopril, Bystolic, fluticasone, lovastatin, omeprazole, Symbicort, and HYDROcodone-acetaminophen. We administered triamcinolone  acetonide, ropivacaine (PF) 2 mg/mL (0.2%), midazolam, lidocaine (PF), and lactated ringers.   Meds ordered this encounter  Medications   triamcinolone acetonide (KENALOG-40) injection 80 mg   sodium chloride flush (NS) 0.9 % injection 10 mL   ropivacaine (PF) 2 mg/mL (0.2%) (NAROPIN) injection 20 mL   midazolam (VERSED) injection 5 mg   lidocaine (PF) (XYLOCAINE) 1 % injection 10 mL   lactated ringers infusion 1,000 mL   HYDROcodone-acetaminophen (NORCO) 10-325 MG tablet    Sig: Take 1 tablet by mouth every 6 (six) hours as needed for moderate pain or severe pain.    Dispense:  120 tablet    Refill:  0   HYDROcodone-acetaminophen (NORCO) 10-325 MG tablet    Sig: Take 1 tablet by mouth every 6 (six) hours as needed for moderate pain or severe pain.    Dispense:  120 tablet    Refill:  0   Patient's Medications  New Prescriptions   HYDROCODONE-ACETAMINOPHEN (NORCO) 10-325 MG TABLET    Take 1 tablet by mouth every 6 (six) hours as needed for moderate pain or severe pain.  Previous Medications   BYSTOLIC 10 MG TABLET    Take 10 mg by mouth daily.    FLUTICASONE (FLONASE) 50 MCG/ACT NASAL SPRAY    Place 1-2 sprays into both nostrils daily as needed (sinus issues/runny nose).    LISINOPRIL (PRINIVIL,ZESTRIL) 20 MG TABLET    Take 20 mg by mouth daily.   LOVASTATIN (MEVACOR) 20 MG TABLET    Take 20 mg by mouth daily.    OMEPRAZOLE (PRILOSEC) 40 MG CAPSULE    Take 40 mg by mouth daily before breakfast.    SYMBICORT 160-4.5 MCG/ACT INHALER    Inhale 1 puff into the lungs at bedtime.   Modified Medications   Modified Medication Previous Medication   HYDROCODONE-ACETAMINOPHEN (NORCO) 10-325 MG TABLET HYDROcodone-acetaminophen (NORCO) 10-325 MG tablet      Take 1 tablet by mouth every 6 (six) hours as needed for moderate pain or severe pain.    Take 1 tablet by mouth every 6 (six) hours as needed for moderate pain or severe pain.  Discontinued Medications   No medications on file    ----------------------------------------------------------------------------------------------------------------------  Follow-up: Return in about 2 months (around 02/26/2021) for evaluation and med refill  VV, med refill, evaluation.   Procedure: Bilateral lumbar medial branch block under fluoroscopic guidance at L3-4 L4-5 L5-S1 with sedation Patient was taken to the fluoroscopy suite and placed in the prone position. A total dose of 2 mg of Versed with 0 cc of fentanyl was titrated for moderate sedation. Vital signs were stable throughout the procedure.  The lumbar area was broadly prepped with Betadine x3 and strict aseptic technique was utilized throughout the procedure. Flouroscopy was used to  identify the areas overlying the aforementioned facets at  L3-4  L4-5 and  L5-S1. 1% lidocaine 1 cc was infiltrated subcutaneously and into the fascia with a 25-gauge needle at each of these sites. I then advanced a 22-gauge 3-1/2 inch Quinckie needle with the needle tip to lie at the "[facility]" portion of the [employer] dog". There was negative aspiration for heme or CSF and  no paresthesia. I then injected 2 cc of ropivacaine 0.2% mixed with 10 mg of triamcinolone at each of the aforementioned sites. These needles were withdrawn.  On the opposite side, I then injected 1% lidocaine as above and advanced 22-gauge Quinky needles as before to the [place] eye portion of the [employer] dog". These needles were placed without paresthesia and with strict aseptic technique and negative aspiration. As before I injected 2 cc of ropivacaine .2% mixed with 10 mg triamcinolone at each site without evidence of IV or subarachnoid symptoms. These needles were withdrawn. The patient was convalesced discharged home in stable condition  Dr. Vashti Hey M.D.   Molli Barrows, MD

## 2020-12-27 NOTE — Progress Notes (Signed)
Safety precautions to be maintained throughout the outpatient stay will include: orient to surroundings, keep bed in low position, maintain call bell within reach at all times, provide assistance with transfer out of bed and ambulation.  

## 2020-12-27 NOTE — Progress Notes (Signed)
1326 '2mg'$  versed slow IVP per MD order. JCS

## 2020-12-27 NOTE — Patient Instructions (Signed)
____________________________________________________________________________________________  General Risks and Possible Complications  Patient Responsibilities: It is important that you read this as it is part of your informed consent. It is our duty to inform you of the risks and possible complications associated with treatments offered to you. It is your responsibility as a patient to read this and to ask questions about anything that is not clear or that you believe was not covered in this document.  Patient's Rights: You have the right to refuse treatment. You also have the right to change your mind, even after initially having agreed to have the treatment done. However, under this last option, if you wait until the last second to change your mind, you may be charged for the materials used up to that point.  Introduction: Medicine is not an exact science. Everything in Medicine, including the lack of treatment(s), carries the potential for danger, harm, or loss (which is by definition: Risk). In Medicine, a complication is a secondary problem, condition, or disease that can aggravate an already existing one. All treatments carry the risk of possible complications. The fact that a side effects or complications occurs, does not imply that the treatment was conducted incorrectly. It must be clearly understood that these can happen even when everything is done following the highest safety standards.  No treatment: You can choose not to proceed with the proposed treatment alternative. The "PRO(s)" would include: avoiding the risk of complications associated with the therapy. The "CON(s)" would include: not getting any of the treatment benefits. These benefits fall under one of three categories: diagnostic; therapeutic; and/or palliative. Diagnostic benefits include: getting information which can ultimately lead to improvement of the disease or symptom(s). Therapeutic benefits are those associated with the  successful treatment of the disease. Finally, palliative benefits are those related to the decrease of the primary symptoms, without necessarily curing the condition (example: decreasing the pain from a flare-up of a chronic condition, such as incurable terminal cancer).  General Risks and Complications: These are associated to most interventional treatments. They can occur alone, or in combination. They fall under one of the following six (6) categories: no benefit or worsening of symptoms; bleeding; infection; nerve damage; allergic reactions; and/or death. No benefits or worsening of symptoms: In Medicine there are no guarantees, only probabilities. No healthcare provider can ever guarantee that a medical treatment will work, they can only state the probability that it may. Furthermore, there is always the possibility that the condition may worsen, either directly, or indirectly, as a consequence of the treatment. Bleeding: This is more common if the patient is taking a blood thinner, either prescription or over the counter (example: Goody Powders, Fish oil, Aspirin, Garlic, etc.), or if suffering a condition associated with impaired coagulation (example: Hemophilia, cirrhosis of the liver, low platelet counts, etc.). However, even if you do not have one on these, it can still happen. If you have any of these conditions, or take one of these drugs, make sure to notify your treating physician. Infection: This is more common in patients with a compromised immune system, either due to disease (example: diabetes, cancer, human immunodeficiency virus [HIV], etc.), or due to medications or treatments (example: therapies used to treat cancer and rheumatological diseases). However, even if you do not have one on these, it can still happen. If you have any of these conditions, or take one of these drugs, make sure to notify your treating physician. Nerve Damage: This is more common when the treatment is an invasive    one, but it can also happen with the use of medications, such as those used in the treatment of cancer. The damage can occur to small secondary nerves, or to large primary ones, such as those in the spinal cord and brain. This damage may be temporary or permanent and it may lead to impairments that can range from temporary numbness to permanent paralysis and/or brain death. Allergic Reactions: Any time a substance or material comes in contact with our body, there is the possibility of an allergic reaction. These can range from a mild skin rash (contact dermatitis) to a severe systemic reaction (anaphylactic reaction), which can result in death. Death: In general, any medical intervention can result in death, most of the time due to an unforeseen complication. ____________________________________________________________________________________________ ____________________________________________________________________________________________  Post-Procedure Discharge Instructions  Instructions: Apply ice:  Purpose: This will minimize any swelling and discomfort after procedure.  When: Day of procedure, as soon as you get home. How: Fill a plastic sandwich bag with crushed ice. Cover it with a small towel and apply to injection site. How long: (15 min on, 15 min off) Apply for 15 minutes then remove x 15 minutes.  Repeat sequence on day of procedure, until you go to bed. Apply heat:  Purpose: To treat any soreness and discomfort from the procedure. When: Starting the next day after the procedure. How: Apply heat to procedure site starting the day following the procedure. How long: May continue to repeat daily, until discomfort goes away. Food intake: Start with clear liquids (like water) and advance to regular food, as tolerated.  Physical activities: Keep activities to a minimum for the first 8 hours after the procedure. After that, then as tolerated. Driving: If you have received any sedation,  be responsible and do not drive. You are not allowed to drive for 24 hours after having sedation. Blood thinner: (Applies only to those taking blood thinners) You may restart your blood thinner 6 hours after your procedure. Insulin: (Applies only to Diabetic patients taking insulin) As soon as you can eat, you may resume your normal dosing schedule. Infection prevention: Keep procedure site clean and dry. Shower daily and clean area with soap and water. Post-procedure Pain Diary: Extremely important that this be done correctly and accurately. Recorded information will be used to determine the next step in treatment. For the purpose of accuracy, follow these rules: Evaluate only the area treated. Do not report or include pain from an untreated area. For the purpose of this evaluation, ignore all other areas of pain, except for the treated area. After your procedure, avoid taking a long nap and attempting to complete the pain diary after you wake up. Instead, set your alarm clock to go off every hour, on the hour, for the initial 8 hours after the procedure. Document the duration of the numbing medicine, and the relief you are getting from it. Do not go to sleep and attempt to complete it later. It will not be accurate. If you received sedation, it is likely that you were given a medication that may cause amnesia. Because of this, completing the diary at a later time may cause the information to be inaccurate. This information is needed to plan your care. Follow-up appointment: Keep your post-procedure follow-up evaluation appointment after the procedure (usually 2 weeks for most procedures, 6 weeks for radiofrequencies). DO NOT FORGET to bring you pain diary with you.   Expect: (What should I expect to see with my procedure?) From numbing medicine (AKA: Local  Anesthetics): Numbness or decrease in pain. You may also experience some weakness, which if present, could last for the duration of the local  anesthetic. Onset: Full effect within 15 minutes of injected. Duration: It will depend on the type of local anesthetic used. On the average, 1 to 8 hours.  From steroids (Applies only if steroids were used): Decrease in swelling or inflammation. Once inflammation is improved, relief of the pain will follow. Onset of benefits: Depends on the amount of swelling present. The more swelling, the longer it will take for the benefits to be seen. In some cases, up to 10 days. Duration: Steroids will stay in the system x 2 weeks. Duration of benefits will depend on multiple posibilities including persistent irritating factors. Side-effects: If present, they may typically last 2 weeks (the duration of the steroids). Frequent: Cramps (if they occur, drink Gatorade and take over-the-counter Magnesium 450-500 mg once to twice a day); water retention with temporary weight gain; increases in blood sugar; decreased immune system response; increased appetite. Occasional: Facial flushing (red, warm cheeks); mood swings; menstrual changes. Uncommon: Long-term decrease or suppression of natural hormones; bone thinning. (These are more common with higher doses or more frequent use. This is why we prefer that our patients avoid having any injection therapies in other practices.)  Very Rare: Severe mood changes; psychosis; aseptic necrosis. From procedure: Some discomfort is to be expected once the numbing medicine wears off. This should be minimal if ice and heat are applied as instructed.  Call if: (When should I call?) You experience numbness and weakness that gets worse with time, as opposed to wearing off. New onset bowel or bladder incontinence. (Applies only to procedures done in the spine)  Emergency Numbers: Durning business hours (Monday - Thursday, 8:00 AM - 4:00 PM) (Friday, 9:00 AM - 12:00 Noon): [phone] After hours: [phone] NOTE: If you are having a problem and are unable connect with, or to  talk to a provider, then go to your nearest urgent care or emergency department. If the problem is serious and urgent, please call 911. ____________________________________________________________________________________________

## 2021-01-04 ENCOUNTER — Telehealth: Payer: Self-pay | Admitting: Anesthesiology

## 2021-01-04 NOTE — Telephone Encounter (Signed)
I contacted the pharmacy, it is too early to fill. He can fill on 01-08-21 unless instructed otherwise by prescriber. Ryan Price told me he broke his foot and Dr. Andree Elk told him he could take extra Hydrocodone. Dr. Andree Elk is on vacation, so I took it upon myself to look at the history of Ryan Price, he does not have a history of filling early. I called Ryan Price, told him I would give authorization to fill early, but Hydrocodone must last 30 days, there will be no early fill for the next month's script for Hydrocodone.Patient understood, pharmacy given authorization to fill early.

## 2021-01-04 NOTE — Telephone Encounter (Signed)
Please call pharmcy and confirm this patients script. He was told by pharmacy that our office needed to call before he could fill his script.

## 2021-06-14 ENCOUNTER — Ambulatory Visit: Payer: Medicare Other | Attending: Anesthesiology | Admitting: Anesthesiology

## 2021-06-14 ENCOUNTER — Encounter: Payer: Self-pay | Admitting: Anesthesiology

## 2021-06-14 ENCOUNTER — Other Ambulatory Visit: Payer: Self-pay

## 2021-06-14 DIAGNOSIS — M5136 Other intervertebral disc degeneration, lumbar region: Secondary | ICD-10-CM

## 2021-06-14 DIAGNOSIS — M5431 Sciatica, right side: Secondary | ICD-10-CM

## 2021-06-14 DIAGNOSIS — M47816 Spondylosis without myelopathy or radiculopathy, lumbar region: Secondary | ICD-10-CM

## 2021-06-14 DIAGNOSIS — F1190 Opioid use, unspecified, uncomplicated: Secondary | ICD-10-CM | POA: Diagnosis not present

## 2021-06-14 DIAGNOSIS — M542 Cervicalgia: Secondary | ICD-10-CM

## 2021-06-14 DIAGNOSIS — G894 Chronic pain syndrome: Secondary | ICD-10-CM | POA: Diagnosis not present

## 2021-06-14 DIAGNOSIS — M5432 Sciatica, left side: Secondary | ICD-10-CM

## 2021-06-14 DIAGNOSIS — M5412 Radiculopathy, cervical region: Secondary | ICD-10-CM

## 2021-06-14 NOTE — Progress Notes (Signed)
Virtual Visit via Telephone Note  I connected with Ryan Price. on 06/14/21 at  1:20 PM EST by telephone and verified that I am speaking with the correct person using two identifiers.  Location: Patient: Home Provider: [facility] control center   I discussed the limitations, risks, security and privacy concerns of performing an evaluation and management service by telephone and the availability of in person appointments. I also discussed with the patient that there may be a patient responsible charge related to this service. The patient expressed understanding and agreed to proceed.   History of Present Illness: I spoke with Ryan Price today regarding his low back pain and worsening sciatica.  He is he is getting a lot of left posterior lateral leg pain and explained its been a problem with pain and its been problematic as of lately.  He last had a facet block back in August of last year.  This helped with his low back pain but he felt like he is experiencing more sciatica symptoms shortly thereafter.  He is updated but over the course of the last 3 to 4 months he has been experiencing bilateral lower extremity sciatica symptoms worse on the left side.  No change in lower extremity strength or function or bowel or bladder function is noted.  He has been taking opioids for pain control as the pain has been recalcitrant and despite efforts at stretching strengthening and core strengthening this pain has persisted.  He states that in the past he has had epidurals for similar pain and they were effective.  Otherwise he is in his usual state of health and any recent changes.  Review of systems: General: No fevers or chills Pulmonary: No shortness of breath or dyspnea Cardiac: No angina or palpitations or lightheadedness GI: No abdominal pain or constipation Psych: No depression    Observations/Objective:  Current Outpatient Medications:    BYSTOLIC 10 MG tablet, Take 10 mg by mouth daily. ,  Disp: , Rfl:    fluticasone (FLONASE) 50 MCG/ACT nasal spray, Place 1-2 sprays into both nostrils daily as needed (sinus issues/runny nose). , Disp: , Rfl:    lisinopril (PRINIVIL,ZESTRIL) 20 MG tablet, Take 20 mg by mouth daily., Disp: , Rfl:    lovastatin (MEVACOR) 20 MG tablet, Take 20 mg by mouth daily. , Disp: , Rfl:    omeprazole (PRILOSEC) 40 MG capsule, Take 40 mg by mouth daily before breakfast. , Disp: , Rfl:    SYMBICORT 160-4.5 MCG/ACT inhaler, Inhale 1 puff into the lungs at bedtime.  (Patient not taking: No sig reported), Disp: , Rfl:    Past Medical History:  Diagnosis Date   Arthritis    Depression    Dyspnea    GERD (gastroesophageal reflux disease)    Hypertension    Right low back pain 02/10/2020     Assessment and Plan: 1. Lumbar facet arthropathy   2. Spondylosis of lumbar region without myelopathy or radiculopathy   3. DDD (degenerative disc disease), lumbar   4. Chronic pain syndrome   5. Chronic, continuous use of opioids   6. Cervicalgia   7. Degenerative disc disease, lumbar   8. Cervical radiculitis   9. Bilateral sciatica   Based on our discussion and his current symptom complex I think an epidural steroid for therapeutic purpose would be indicated.  He reports that he has had these in the past with good success but unfortunately his current bout with the sciatica has been unremitting and despite opioid  therapy has limited his activity and is affecting his ability to sleep and function.  As a result, we will schedule him for an epidural steroid at his next visit.  I have encouraged him to continue with stretching strengthening exercises as previously reviewed.  I have reviewed the [facility] practitioner database information is appropriate to continue on his current medication regimen.  Currently his opioid therapy is being managed by Dr. Doy Hutching.  Once again he is to continue to follow with his primary care physician for baseline medical care as well.  He is  scheduled for return to clinic in 1 month  Follow Up Instructions:    I discussed the assessment and treatment plan with the patient. The patient was provided an opportunity to ask questions and all were answered. The patient agreed with the plan and demonstrated an understanding of the instructions.   The patient was advised to call back or seek an in-person evaluation if the symptoms worsen or if the condition fails to improve as anticipated.  I provided 30 minutes of non-face-to-face time during this encounter.   Molli Barrows, MD

## 2021-07-07 ENCOUNTER — Ambulatory Visit: Payer: Medicare Other | Admitting: Anesthesiology

## 2021-07-19 ENCOUNTER — Other Ambulatory Visit: Payer: Self-pay

## 2021-07-19 ENCOUNTER — Other Ambulatory Visit: Payer: Self-pay | Admitting: Anesthesiology

## 2021-07-19 ENCOUNTER — Ambulatory Visit
Admission: RE | Admit: 2021-07-19 | Discharge: 2021-07-19 | Disposition: A | Discharge status: Home / Self Care | Payer: Medicare Other | Source: Ambulatory Visit | Attending: Anesthesiology | Admitting: Anesthesiology

## 2021-07-19 ENCOUNTER — Encounter: Payer: Self-pay | Admitting: Anesthesiology

## 2021-07-19 ENCOUNTER — Ambulatory Visit (HOSPITAL_BASED_OUTPATIENT_CLINIC_OR_DEPARTMENT_OTHER): Payer: Medicare Other | Admitting: Anesthesiology

## 2021-07-19 VITALS — BP 107/88 | HR 77 | Temp 97.8°F | Resp 16 | Ht 75.0 in | Wt 262.0 lb

## 2021-07-19 DIAGNOSIS — G8929 Other chronic pain: Secondary | ICD-10-CM | POA: Diagnosis present

## 2021-07-19 DIAGNOSIS — M5136 Other intervertebral disc degeneration, lumbar region: Secondary | ICD-10-CM | POA: Diagnosis present

## 2021-07-19 DIAGNOSIS — G894 Chronic pain syndrome: Secondary | ICD-10-CM | POA: Insufficient documentation

## 2021-07-19 DIAGNOSIS — M542 Cervicalgia: Secondary | ICD-10-CM | POA: Diagnosis present

## 2021-07-19 DIAGNOSIS — M47816 Spondylosis without myelopathy or radiculopathy, lumbar region: Secondary | ICD-10-CM | POA: Diagnosis present

## 2021-07-19 DIAGNOSIS — M545 Low back pain, unspecified: Secondary | ICD-10-CM | POA: Diagnosis present

## 2021-07-19 DIAGNOSIS — R52 Pain, unspecified: Secondary | ICD-10-CM

## 2021-07-19 DIAGNOSIS — M5432 Sciatica, left side: Secondary | ICD-10-CM | POA: Diagnosis present

## 2021-07-19 DIAGNOSIS — M5412 Radiculopathy, cervical region: Secondary | ICD-10-CM | POA: Diagnosis present

## 2021-07-19 DIAGNOSIS — M5431 Sciatica, right side: Secondary | ICD-10-CM | POA: Insufficient documentation

## 2021-07-19 DIAGNOSIS — F1190 Opioid use, unspecified, uncomplicated: Secondary | ICD-10-CM

## 2021-07-19 MED ORDER — ROPIVACAINE HCL 2 MG/ML IJ SOLN
INTRAMUSCULAR | Status: AC
  Filled 2021-07-19: qty 20

## 2021-07-19 MED ORDER — SODIUM CHLORIDE (PF) 0.9 % IJ SOLN
INTRAMUSCULAR | Status: AC
  Filled 2021-07-19: qty 10

## 2021-07-19 MED ORDER — LIDOCAINE HCL (PF) 1 % IJ SOLN
5.0000 mL | Freq: Once | INTRAMUSCULAR | Status: AC
  Administered 2021-07-19: 5 mL via SUBCUTANEOUS

## 2021-07-19 MED ORDER — MIDAZOLAM HCL 2 MG/2ML IJ SOLN
2.0000 mg | Freq: Once | INTRAMUSCULAR | Status: AC
  Administered 2021-07-19: 2 mg via INTRAVENOUS
  Filled 2021-07-19: qty 2

## 2021-07-19 MED ORDER — SODIUM CHLORIDE 0.9% FLUSH
10.0000 mL | Freq: Once | INTRAVENOUS | Status: AC
  Administered 2021-07-19: 10 mL

## 2021-07-19 MED ORDER — IOHEXOL 180 MG/ML  SOLN
10.0000 mL | Freq: Once | INTRAMUSCULAR | Status: AC | PRN
  Administered 2021-07-19: 5 mL via EPIDURAL

## 2021-07-19 MED ORDER — LACTATED RINGERS IV SOLN
1000.0000 mL | INTRAVENOUS | Status: DC
  Administered 2021-07-19: 1000 mL via INTRAVENOUS

## 2021-07-19 MED ORDER — DEXAMETHASONE SODIUM PHOSPHATE 10 MG/ML IJ SOLN
10.0000 mg | Freq: Once | INTRAMUSCULAR | Status: AC
  Administered 2021-07-19: 10 mg
  Filled 2021-07-19: qty 1

## 2021-07-19 MED ORDER — ROPIVACAINE HCL 2 MG/ML IJ SOLN
10.0000 mL | Freq: Once | INTRAMUSCULAR | Status: AC
  Administered 2021-07-19: 10 mL via EPIDURAL

## 2021-07-19 MED ORDER — TRIAMCINOLONE ACETONIDE 40 MG/ML IJ SUSP
40.0000 mg | Freq: Once | INTRAMUSCULAR | Status: AC
Start: 2021-07-19 — End: 2021-07-19
  Administered 2021-07-19: 40 mg

## 2021-07-19 MED ORDER — LIDOCAINE HCL (PF) 1 % IJ SOLN
INTRAMUSCULAR | Status: AC
  Filled 2021-07-19: qty 10

## 2021-07-19 MED ORDER — MIDAZOLAM HCL 5 MG/5ML IJ SOLN
INTRAMUSCULAR | Status: AC
  Filled 2021-07-19: qty 5

## 2021-07-19 MED ORDER — TRIAMCINOLONE ACETONIDE 40 MG/ML IJ SUSP
INTRAMUSCULAR | Status: AC
  Filled 2021-07-19: qty 1

## 2021-07-19 NOTE — Progress Notes (Signed)
Safety precautions to be maintained throughout the outpatient stay will include: orient to surroundings, keep bed in low position, maintain call bell within reach at all times, provide assistance with transfer out of bed and ambulation.  

## 2021-07-20 ENCOUNTER — Telehealth: Payer: Self-pay | Admitting: *Deleted

## 2021-07-20 NOTE — Telephone Encounter (Signed)
No problems post procedure. 

## 2021-07-21 ENCOUNTER — Encounter: Payer: Self-pay | Admitting: Anesthesiology

## 2021-07-21 NOTE — Progress Notes (Signed)
Subjective:  Patient ID: Ryan Schwalbe., male    DOB: 1960-11-27  Age: 61 y.o. MRN: [id number]  CC: Back Pain (Low right)   Procedure: L5-S1 epidural steroid under fluoroscopic guidance with no sedation  HPI Ryan Hack. presents for reevaluation.  Ryan Price continues to have bilateral sciatica symptoms worse on the right side than left.  This unfortunately has been quite recalcitrant and in the past he has had epidural steroids for these.  Most recently in 2022 he had bilateral facet injections to help with his back pain and hip pain however this time he is having more radiating pain into the right posterior calf more so than on the left side.  Despite efforts at stretching strengthening and conservative care with medication management he continues to have these.  He has had epidural steroid injections he reports in the past and they were effective in eliminating the vast majority of this pain.  The low back pain has been stable in nature and he takes his medications for that.  No side effects from the medications are reported and he continues to try and do efforts at stretching strengthening and weight loss with limited success.  Outpatient Medications Prior to Visit  Medication Sig Dispense Refill   BYSTOLIC 10 MG tablet Take 10 mg by mouth daily.      fluticasone (FLONASE) 50 MCG/ACT nasal spray Place 1-2 sprays into both nostrils daily as needed (sinus issues/runny nose).      lisinopril (PRINIVIL,ZESTRIL) 20 MG tablet Take 20 mg by mouth daily.     lovastatin (MEVACOR) 20 MG tablet Take 20 mg by mouth daily.      omeprazole (PRILOSEC) 40 MG capsule Take 40 mg by mouth daily before breakfast.      SYMBICORT 160-4.5 MCG/ACT inhaler Inhale 1 puff into the lungs at bedtime.  (Patient not taking: Reported on 07/19/2021)     No facility-administered medications prior to visit.    Review of Systems CNS: No confusion or sedation Cardiac: No angina or palpitations GI: No abdominal  pain or constipation Constitutional: No nausea vomiting fevers or chills  Objective:  BP 107/88    Pulse 77    Temp 97.8 F (36.6 C)    Resp 16    Ht '6\' 3"'$  (1.905 m)    Wt 262 lb (118.8 kg)    SpO2 99%    BMI 32.75 kg/m    BP Readings from Last 3 Encounters:  07/19/21 107/88  12/27/20 123/74  09/15/20 108/69     Wt Readings from Last 3 Encounters:  07/19/21 262 lb (118.8 kg)  12/27/20 272 lb (123.4 kg)  09/15/20 262 lb (118.8 kg)     Physical Exam Pt is alert and oriented PERRL EOMI HEART IS RRR no murmur or rub LCTA no wheezing or rales MUSCULOSKELETAL reveals some paraspinous muscle tenderness in the lumbar region there is a trigger point on the right lower lumbar region approximately L5.  This is just to the right of midline.  This does reproduce some of his pain radiating into the buttock and right hip but not down the leg.  His muscle tone and bulk is good and he does have a positive straight leg raise on the right side.  He ambulates with a mildly antalgic gait.  Labs  No results found for: HGBA1C Lab Results  Component Value Date   CREATININE 0.81 05/26/2015    -------------------------------------------------------------------------------------------------------------------- Lab Results  Component Value Date   WBC 13.6 (H)  05/26/2015   HGB 13.9 04/26/2016   HCT 48.0 05/26/2015   PLT 242 05/26/2015   GLUCOSE 117 (H) 05/26/2015   ALT 28 05/26/2015   AST 21 05/26/2015   NA 138 05/26/2015   K 4.1 05/26/2015   CL 112 (H) 05/26/2015   CREATININE 0.81 05/26/2015   BUN 13 05/26/2015   CO2 21 (L) 05/26/2015    --------------------------------------------------------------------------------------------------------------------- DG PAIN CLINIC C-ARM 1-60 MIN NO REPORT  Result Date: 07/19/2021 Fluoro was used, but no Radiologist interpretation will be provided. Please refer to "NOTES" tab for provider progress note.    Assessment & Plan:   Steel was seen  today for back pain.  Diagnoses and all orders for this visit:  Lumbar facet arthropathy  Spondylosis of lumbar region without myelopathy or radiculopathy -     Lumbar Epidural Injection -     Lumbar Epidural Injection; Future  DDD (degenerative disc disease), lumbar -     Lumbar Epidural Injection -     Lumbar Epidural Injection; Future  Bilateral sciatica -     Lumbar Epidural Injection -     Lumbar Epidural Injection; Future  Chronic pain syndrome  Chronic, continuous use of opioids  Chronic right-sided low back pain without sciatica  Cervical radiculitis  Cervicalgia  Other orders -     triamcinolone acetonide (KENALOG-40) injection 40 mg -     sodium chloride flush (NS) 0.9 % injection 10 mL -     ropivacaine (PF) 2 mg/mL (0.2%) (NAROPIN) injection 10 mL -     midazolam (VERSED) injection 2 mg -     lidocaine (PF) (XYLOCAINE) 1 % injection 5 mL -     lactated ringers infusion 1,000 mL -     iohexol (OMNIPAQUE) 180 MG/ML injection 10 mL -     dexamethasone (DECADRON) injection 10 mg        ----------------------------------------------------------------------------------------------------------------------  Problem List Items Addressed This Visit       Unprioritized   Cervical radiculitis   Chronic low back pain   Chronic pain   Chronic, continuous use of opioids   DDD (degenerative disc disease), lumbar   Relevant Orders   Lumbar Epidural Injection   Other Visit Diagnoses     Lumbar facet arthropathy    -  Primary   Spondylosis of lumbar region without myelopathy or radiculopathy       Relevant Medications   triamcinolone acetonide (KENALOG-40) injection 40 mg (Completed)   dexamethasone (DECADRON) injection 10 mg (Completed)   Other Relevant Orders   Lumbar Epidural Injection   Bilateral sciatica       Relevant Medications   midazolam (VERSED) injection 2 mg (Completed)   Other Relevant Orders   Lumbar Epidural Injection   Cervicalgia              ----------------------------------------------------------------------------------------------------------------------  1. Spondylosis of lumbar region without myelopathy or radiculopathy Based on her discussion today I think is appropriate to proceed with a repeat epidural injection.  He understands the risks and benefits of the procedure in detail.  We will schedule him for return to clinic in 2 months.  I want him to continue with core stretching strengthening exercises and current medication management without change there.  Continue follow-up with his primary care physicians for baseline medical care additionally. - Lumbar Epidural Injection - Lumbar Epidural Injection; Future  2. DDD (degenerative disc disease), lumbar As above - Lumbar Epidural Injection - Lumbar Epidural Injection; Future  3. Bilateral sciatica As above.  Return to clinic in 3 months and possible repeat epidural if indicated at that time - Lumbar Epidural Injection - Lumbar Epidural Injection; Future  4. Lumbar facet arthropathy We will defer on any repeat facet injections at this time  5. Chronic pain syndrome   6. Chronic, continuous use of opioids Continue follow-up with Dr. Doy Hutching for medication management  7. Chronic right-sided low back pain without sciatica We will proceed with a trigger point injection as reviewed with him today to see if this can help with some of the muscle spasmin  8. Cervical radiculitis Continue current care with stretching strengthening exercises  9. Cervicalgia     ----------------------------------------------------------------------------------------------------------------------  I am having Ryan Schwalbe. maintain his lisinopril, Bystolic, fluticasone, lovastatin, omeprazole, and Symbicort. We administered triamcinolone acetonide, sodium chloride flush, ropivacaine (PF) 2 mg/mL (0.2%), midazolam, lidocaine (PF), lactated ringers, iohexol, and  dexamethasone.   Meds ordered this encounter  Medications   triamcinolone acetonide (KENALOG-40) injection 40 mg   sodium chloride flush (NS) 0.9 % injection 10 mL   ropivacaine (PF) 2 mg/mL (0.2%) (NAROPIN) injection 10 mL   midazolam (VERSED) injection 2 mg   lidocaine (PF) (XYLOCAINE) 1 % injection 5 mL   lactated ringers infusion 1,000 mL   iohexol (OMNIPAQUE) 180 MG/ML injection 10 mL   dexamethasone (DECADRON) injection 10 mg   Patient's Medications  New Prescriptions   No medications on file  Previous Medications   BYSTOLIC 10 MG TABLET    Take 10 mg by mouth daily.    FLUTICASONE (FLONASE) 50 MCG/ACT NASAL SPRAY    Place 1-2 sprays into both nostrils daily as needed (sinus issues/runny nose).    LISINOPRIL (PRINIVIL,ZESTRIL) 20 MG TABLET    Take 20 mg by mouth daily.   LOVASTATIN (MEVACOR) 20 MG TABLET    Take 20 mg by mouth daily.    OMEPRAZOLE (PRILOSEC) 40 MG CAPSULE    Take 40 mg by mouth daily before breakfast.    SYMBICORT 160-4.5 MCG/ACT INHALER    Inhale 1 puff into the lungs at bedtime.   Modified Medications   No medications on file  Discontinued Medications   No medications on file   ----------------------------------------------------------------------------------------------------------------------  Follow-up: Return in about 3 years (around 07/19/2024) for evaluation, procedure.    Molli Barrows, MD   Procedure: L5-S LESI with fluoroscopic guidance and no moderate sedation  NOTE: The risks, benefits, and expectations of the procedure have been discussed and explained to the patient who was understanding and in agreement with suggested treatment plan. No guarantees were made.  DESCRIPTION OF PROCEDURE: Lumbar epidural steroid injection with no IV Versed, EKG, blood pressure, pulse, and pulse oximetry monitoring. The procedure was performed with the patient in the prone position under fluoroscopic guidance.  Sterile prep x3 was initiated and I then injected  subcutaneous lidocaine to the overlying L5-S1 site after its fluoroscopic identifictation.  Using strict aseptic technique, I then advanced an 18-gauge Tuohy epidural needle in the midline using interlaminar approach via loss-of-resistance to saline technique. There was negative aspiration for heme or  CSF.  I then confirmed position with both AP and Lateral fluoroscan.  2 cc of contrast dye were injected and a  total of 5 mL of Preservative-Free normal saline mixed with 40 mg of Kenalog and 1cc Ropicaine 0.2 percent were injected incrementally via the  epidurally placed needle. The needle was removed. The patient tolerated the injection well and was convalesced and discharged to home in stable condition. Should  the patient have any post procedure difficulty they have been instructed on how to contact us for assistance.

## 2021-10-31 ENCOUNTER — Encounter: Payer: Self-pay | Admitting: Anesthesiology

## 2021-10-31 ENCOUNTER — Ambulatory Visit
Admission: RE | Admit: 2021-10-31 | Discharge: 2021-10-31 | Disposition: A | Discharge status: Home / Self Care | Payer: Medicare Other | Source: Ambulatory Visit | Attending: Anesthesiology | Admitting: Anesthesiology

## 2021-10-31 ENCOUNTER — Other Ambulatory Visit: Payer: Self-pay | Admitting: Anesthesiology

## 2021-10-31 ENCOUNTER — Ambulatory Visit (HOSPITAL_BASED_OUTPATIENT_CLINIC_OR_DEPARTMENT_OTHER): Payer: Medicare Other | Admitting: Anesthesiology

## 2021-10-31 ENCOUNTER — Other Ambulatory Visit: Payer: Self-pay

## 2021-10-31 VITALS — BP 130/86 | HR 74 | Temp 98.1°F | Resp 22 | Ht 75.0 in | Wt 262.0 lb

## 2021-10-31 DIAGNOSIS — M5431 Sciatica, right side: Secondary | ICD-10-CM

## 2021-10-31 DIAGNOSIS — R52 Pain, unspecified: Secondary | ICD-10-CM | POA: Insufficient documentation

## 2021-10-31 DIAGNOSIS — M47816 Spondylosis without myelopathy or radiculopathy, lumbar region: Secondary | ICD-10-CM | POA: Diagnosis present

## 2021-10-31 DIAGNOSIS — M51369 Other intervertebral disc degeneration, lumbar region without mention of lumbar back pain or lower extremity pain: Secondary | ICD-10-CM

## 2021-10-31 DIAGNOSIS — M542 Cervicalgia: Secondary | ICD-10-CM | POA: Insufficient documentation

## 2021-10-31 DIAGNOSIS — M5432 Sciatica, left side: Secondary | ICD-10-CM | POA: Insufficient documentation

## 2021-10-31 DIAGNOSIS — F1190 Opioid use, unspecified, uncomplicated: Secondary | ICD-10-CM | POA: Insufficient documentation

## 2021-10-31 DIAGNOSIS — G894 Chronic pain syndrome: Secondary | ICD-10-CM | POA: Diagnosis present

## 2021-10-31 DIAGNOSIS — M5136 Other intervertebral disc degeneration, lumbar region: Secondary | ICD-10-CM | POA: Insufficient documentation

## 2021-10-31 MED ORDER — TRIAMCINOLONE ACETONIDE 40 MG/ML IJ SUSP
40.0000 mg | Freq: Once | INTRAMUSCULAR | Status: AC
  Administered 2021-10-31: 40 mg
  Filled 2021-10-31: qty 1

## 2021-10-31 MED ORDER — SODIUM CHLORIDE (PF) 0.9 % IJ SOLN
INTRAMUSCULAR | Status: AC
  Filled 2021-10-31: qty 10

## 2021-10-31 MED ORDER — ROPIVACAINE HCL 2 MG/ML IJ SOLN
10.0000 mL | Freq: Once | INTRAMUSCULAR | Status: AC
  Administered 2021-10-31: 1 mL via EPIDURAL
  Filled 2021-10-31: qty 20

## 2021-10-31 MED ORDER — MIDAZOLAM HCL 5 MG/5ML IJ SOLN
INTRAMUSCULAR | Status: AC
  Filled 2021-10-31: qty 5

## 2021-10-31 MED ORDER — LIDOCAINE HCL (PF) 1 % IJ SOLN
5.0000 mL | Freq: Once | INTRAMUSCULAR | Status: AC
  Administered 2021-10-31: 5 mL via SUBCUTANEOUS
  Filled 2021-10-31: qty 5

## 2021-10-31 MED ORDER — SODIUM CHLORIDE 0.9% FLUSH
10.0000 mL | Freq: Once | INTRAVENOUS | Status: AC
  Administered 2021-10-31: 10 mL

## 2021-10-31 MED ORDER — MIDAZOLAM HCL 2 MG/2ML IJ SOLN
2.0000 mg | Freq: Once | INTRAMUSCULAR | Status: AC
  Administered 2021-10-31: 2 mg via INTRAVENOUS

## 2021-10-31 MED ORDER — IOHEXOL 180 MG/ML  SOLN
INTRAMUSCULAR | Status: AC
  Filled 2021-10-31: qty 20

## 2021-10-31 MED ORDER — LACTATED RINGERS IV SOLN: INTRAVENOUS | Status: DC

## 2021-10-31 MED ORDER — IOPAMIDOL (ISOVUE-M 200) INJECTION 41%: 20.0000 mL | Freq: Once | INTRAMUSCULAR | Status: DC | PRN

## 2021-10-31 NOTE — Patient Instructions (Signed)

## 2021-10-31 NOTE — Progress Notes (Unsigned)
Safety precautions to be maintained throughout the outpatient stay will include: orient to surroundings, keep bed in low position, maintain call bell within reach at all times, provide assistance with transfer out of bed and ambulation.  

## 2021-11-01 ENCOUNTER — Encounter: Payer: Self-pay | Admitting: Anesthesiology

## 2021-11-01 NOTE — Progress Notes (Signed)
Subjective:  Patient ID: Ryan Price., male    DOB: 08-20-1960  Age: 61 y.o. MRN: [id number]  CC: Back Pain (Lumbar, left is worse )   Procedure: L5-S1 epidural steroid under fluoroscopic guidance with minimal sedation  HPI Ryan Price. presents for reevaluation.  He was last seen a few months ago and had an epidural steroid injection that yielded significant improvement in his left lower leg pain.  He reports that he was almost 100% pain-free for approximately 6 to 7 weeks.  He presents today requesting a repeat epidural following his most recent March epidural.  The quality characteristic and distribution of this pain are similar to what he experienced in the past but not quite as severe.  He still getting a lot of left greater than right sciatica pain comparable to what he had prior to his last epidural.  No change in lower extremity strength or function or bowel or bladder function is noted.  He still trying to stay active but unfortunately the pain in his low back has been quite recalcitrant with recurrent sciatica for many years.  Otherwise he is in his usual state of health.  Outpatient Medications Prior to Visit  Medication Sig Dispense Refill   BYSTOLIC 10 MG tablet Take 10 mg by mouth daily.      HYDROcodone-acetaminophen (NORCO) 10-325 MG tablet Take 1 tablet by mouth every 6 (six) hours as needed.     lisinopril (PRINIVIL,ZESTRIL) 20 MG tablet Take 20 mg by mouth daily.     lovastatin (MEVACOR) 20 MG tablet Take 20 mg by mouth daily.      omeprazole (PRILOSEC) 40 MG capsule Take 40 mg by mouth daily before breakfast.      fluticasone (FLONASE) 50 MCG/ACT nasal spray Place 1-2 sprays into both nostrils daily as needed (sinus issues/runny nose).  (Patient not taking: Reported on 10/31/2021)     SYMBICORT 160-4.5 MCG/ACT inhaler Inhale 1 puff into the lungs at bedtime.  (Patient not taking: Reported on 07/19/2021)     No facility-administered medications prior to visit.     Review of Systems CNS: No confusion or sedation Cardiac: No angina or palpitations GI: No abdominal pain or constipation Constitutional: No nausea vomiting fevers or chills  Objective:  BP 130/86   Pulse 74   Temp 98.1 F (36.7 C) (Temporal)   Resp (!) 22   Ht '6\' 3"'$  (1.905 m)   Wt 262 lb (118.8 kg)   SpO2 97%   BMI 32.75 kg/m    BP Readings from Last 3 Encounters:  10/31/21 130/86  07/19/21 107/88  12/27/20 123/74     Wt Readings from Last 3 Encounters:  10/31/21 262 lb (118.8 kg)  07/19/21 262 lb (118.8 kg)  12/27/20 272 lb (123.4 kg)     Physical Exam Pt is alert and oriented PERRL EOMI HEART IS RRR no murmur or rub LCTA no wheezing or rales MUSCULOSKELETAL reveals some paraspinous muscle tenderness but no overt trigger points.  He does have a positive straight leg raise on the left side negative on the right.  He has pain on extension of the low back his muscle tone and bulk is at baseline.  He is does ambulate with a mildly antalgic gait but seems quite functional  Labs  No results found for: "HGBA1C" Lab Results  Component Value Date   CREATININE 0.81 05/26/2015    -------------------------------------------------------------------------------------------------------------------- Lab Results  Component Value Date   WBC 13.6 (H) 05/26/2015  HGB 13.9 04/26/2016   HCT 48.0 05/26/2015   PLT 242 05/26/2015   GLUCOSE 117 (H) 05/26/2015   ALT 28 05/26/2015   AST 21 05/26/2015   NA 138 05/26/2015   K 4.1 05/26/2015   CL 112 (H) 05/26/2015   CREATININE 0.81 05/26/2015   BUN 13 05/26/2015   CO2 21 (L) 05/26/2015    --------------------------------------------------------------------------------------------------------------------- DG PAIN CLINIC C-ARM 1-60 MIN NO REPORT  Result Date: 10/31/2021 Fluoro was used, but no Radiologist interpretation will be provided. Please refer to "NOTES" tab for provider progress note.    Assessment & Plan:    Moody was seen today for back pain.  Diagnoses and all orders for this visit:  Spondylosis of lumbar region without myelopathy or radiculopathy  DDD (degenerative disc disease), lumbar  Bilateral sciatica  Lumbar facet arthropathy  Chronic pain syndrome  Chronic, continuous use of opioids  Cervicalgia  Degenerative disc disease, lumbar  Other orders -     triamcinolone acetonide (KENALOG-40) injection 40 mg -     sodium chloride flush (NS) 0.9 % injection 10 mL -     ropivacaine (PF) 2 mg/mL (0.2%) (NAROPIN) injection 10 mL -     iopamidol (ISOVUE-M) 41 % intrathecal injection 20 mL -     lactated ringers infusion -     lidocaine (PF) (XYLOCAINE) 1 % injection 5 mL -     midazolam (VERSED) injection 2 mg        ----------------------------------------------------------------------------------------------------------------------  Problem List Items Addressed This Visit       Unprioritized   Chronic pain   Relevant Medications   HYDROcodone-acetaminophen (NORCO) 10-325 MG tablet   Chronic, continuous use of opioids   DDD (degenerative disc disease), lumbar   Relevant Medications   HYDROcodone-acetaminophen (NORCO) 10-325 MG tablet   Degenerative disc disease, lumbar   Relevant Medications   HYDROcodone-acetaminophen (NORCO) 10-325 MG tablet   Other Visit Diagnoses     Spondylosis of lumbar region without myelopathy or radiculopathy    -  Primary   Relevant Medications   triamcinolone acetonide (KENALOG-40) injection 40 mg (Completed)   HYDROcodone-acetaminophen (NORCO) 10-325 MG tablet   Bilateral sciatica       Relevant Medications   midazolam (VERSED) injection 2 mg (Completed)   Lumbar facet arthropathy       Cervicalgia             ----------------------------------------------------------------------------------------------------------------------  1. Spondylosis of lumbar region without myelopathy or radiculopathy   2. DDD (degenerative  disc disease), lumbar We will proceed with a repeat epidural today.  Of gone over the risks and benefits of this with him in full detail all questions were answered.  I want to continue his core stretching strengthening exercises and ambulation with return to clinic scheduled in 3 months for repeat epidural at that time.  We will continue with his current medication regimen and this is done by his primary care physician.  3. Bilateral sciatica As above  4. Lumbar facet arthropathy As above with continuation of core strengthening  5. Chronic pain syndrome   6. Chronic, continuous use of opioids   7. Cervicalgia   8. Degenerative disc disease, lumbar As above    ----------------------------------------------------------------------------------------------------------------------  I am having Ryan Price. maintain his lisinopril, Bystolic, fluticasone, lovastatin, omeprazole, Symbicort, and HYDROcodone-acetaminophen. We administered triamcinolone acetonide, sodium chloride flush, ropivacaine (PF) 2 mg/mL (0.2%), lactated ringers, lidocaine (PF), and midazolam.   Meds ordered this encounter  Medications   triamcinolone acetonide (KENALOG-40) injection  40 mg   sodium chloride flush (NS) 0.9 % injection 10 mL   ropivacaine (PF) 2 mg/mL (0.2%) (NAROPIN) injection 10 mL   iopamidol (ISOVUE-M) 41 % intrathecal injection 20 mL   lactated ringers infusion   lidocaine (PF) (XYLOCAINE) 1 % injection 5 mL   midazolam (VERSED) injection 2 mg   Patient's Medications  New Prescriptions   No medications on file  Previous Medications   BYSTOLIC 10 MG TABLET    Take 10 mg by mouth daily.    FLUTICASONE (FLONASE) 50 MCG/ACT NASAL SPRAY    Place 1-2 sprays into both nostrils daily as needed (sinus issues/runny nose).    HYDROCODONE-ACETAMINOPHEN (NORCO) 10-325 MG TABLET    Take 1 tablet by mouth every 6 (six) hours as needed.   LISINOPRIL (PRINIVIL,ZESTRIL) 20 MG TABLET    Take 20 mg  by mouth daily.   LOVASTATIN (MEVACOR) 20 MG TABLET    Take 20 mg by mouth daily.    OMEPRAZOLE (PRILOSEC) 40 MG CAPSULE    Take 40 mg by mouth daily before breakfast.    SYMBICORT 160-4.5 MCG/ACT INHALER    Inhale 1 puff into the lungs at bedtime.   Modified Medications   No medications on file  Discontinued Medications   No medications on file   ----------------------------------------------------------------------------------------------------------------------  Follow-up: No follow-ups on file.   Procedure: L5-S1 LESI with fluoroscopic guidance and minimal  sedation  NOTE: The risks, benefits, and expectations of the procedure have been discussed and explained to the patient who was understanding and in agreement with suggested treatment plan. No guarantees were made.  DESCRIPTION OF PROCEDURE: Lumbar epidural steroid injection with 2 mg IV Versed, EKG, blood pressure, pulse, and pulse oximetry monitoring. The procedure was performed with the patient in the prone position under fluoroscopic guidance.  Sterile prep x3 was initiated and I then injected subcutaneous lidocaine to the overlying L5-S1 site after its fluoroscopic identifictation.  Using strict aseptic technique, I then advanced an 18-gauge Tuohy epidural needle in the midline using interlaminar approach via loss-of-resistance to saline technique. There was negative aspiration for heme or  CSF.  I then confirmed position with both AP and Lateral fluoroscan.  2 cc of contrast dye were injected and a  total of 5 mL of Preservative-Free normal saline mixed with 40 mg of Kenalog and 1cc Ropicaine 0.2 percent were injected incrementally via the  epidurally placed needle. The needle was removed. The patient tolerated the injection well and was convalesced and discharged to home in stable condition. Should the patient have any post procedure difficulty they have been instructed on how to contact us for assistance.    Molli Barrows, MD

## 2021-11-13 ENCOUNTER — Encounter: Payer: Self-pay | Admitting: Emergency Medicine

## 2021-11-13 ENCOUNTER — Emergency Department
Admission: EM | Admit: 2021-11-13 | Discharge: 2021-11-13 | Disposition: A | Discharge status: Home / Self Care | Payer: Medicare Other | Attending: Emergency Medicine | Admitting: Emergency Medicine

## 2021-11-13 ENCOUNTER — Emergency Department: Payer: Medicare Other

## 2021-11-13 ENCOUNTER — Other Ambulatory Visit: Payer: Self-pay

## 2021-11-13 DIAGNOSIS — R112 Nausea with vomiting, unspecified: Secondary | ICD-10-CM | POA: Insufficient documentation

## 2021-11-13 DIAGNOSIS — R1084 Generalized abdominal pain: Secondary | ICD-10-CM | POA: Diagnosis present

## 2021-11-13 DIAGNOSIS — R197 Diarrhea, unspecified: Secondary | ICD-10-CM | POA: Diagnosis not present

## 2021-11-13 DIAGNOSIS — K529 Noninfective gastroenteritis and colitis, unspecified: Secondary | ICD-10-CM

## 2021-11-13 LAB — CBC
HCT: 41.8 % (ref 39.0–52.0)
Hemoglobin: 14.3 g/dL (ref 13.0–17.0)
MCH: 31.5 pg (ref 26.0–34.0)
MCHC: 34.2 g/dL (ref 30.0–36.0)
MCV: 92.1 fL (ref 80.0–100.0)
Platelets: 291 10*3/uL (ref 150–400)
RBC: 4.54 MIL/uL (ref 4.22–5.81)
RDW: 12.5 % (ref 11.5–15.5)
WBC: 8.8 10*3/uL (ref 4.0–10.5)
nRBC: 0 % (ref 0.0–0.2)

## 2021-11-13 LAB — URINALYSIS, ROUTINE W REFLEX MICROSCOPIC
Bilirubin Urine: NEGATIVE
Glucose, UA: NEGATIVE mg/dL
Hgb urine dipstick: NEGATIVE
Ketones, ur: NEGATIVE mg/dL
Leukocytes,Ua: NEGATIVE
Nitrite: NEGATIVE
Protein, ur: 30 mg/dL — AB
Specific Gravity, Urine: 1.024 (ref 1.005–1.030)
pH: 5 (ref 5.0–8.0)

## 2021-11-13 LAB — COMPREHENSIVE METABOLIC PANEL
ALT: 44 U/L (ref 0–44)
AST: 37 U/L (ref 15–41)
Albumin: 3.8 g/dL (ref 3.5–5.0)
Alkaline Phosphatase: 55 U/L (ref 38–126)
Anion gap: 12 (ref 5–15)
BUN: 14 mg/dL (ref 6–20)
CO2: 28 mmol/L (ref 22–32)
Calcium: 8.9 mg/dL (ref 8.9–10.3)
Chloride: 98 mmol/L (ref 98–111)
Creatinine, Ser: 1.1 mg/dL (ref 0.61–1.24)
GFR, Estimated: >60 mL/min (ref >60)
Glucose, Bld: 113 mg/dL — ABNORMAL HIGH (ref 70–99)
Potassium: 3.7 mmol/L (ref 3.5–5.1)
Sodium: 138 mmol/L (ref 135–145)
Total Bilirubin: 0.7 mg/dL (ref 0.3–1.2)
Total Protein: 7.5 g/dL (ref 6.5–8.1)

## 2021-11-13 LAB — LIPASE, BLOOD: Lipase: 35 U/L (ref 11–51)

## 2021-11-13 MED ORDER — ONDANSETRON HCL 4 MG PO TABS: 4.0000 mg | ORAL_TABLET | Freq: Three times a day (TID) | ORAL | 0 refills | Status: AC | PRN

## 2021-11-13 MED ORDER — DICYCLOMINE HCL 10 MG PO CAPS: 10.0000 mg | ORAL_CAPSULE | Freq: Three times a day (TID) | ORAL | 0 refills | Status: AC | PRN

## 2021-11-13 MED ORDER — MORPHINE SULFATE (PF) 4 MG/ML IV SOLN
4.0000 mg | Freq: Once | INTRAVENOUS | Status: AC
  Administered 2021-11-13: 4 mg via INTRAVENOUS
  Filled 2021-11-13: qty 1

## 2021-11-13 MED ORDER — IOHEXOL 300 MG/ML  SOLN
100.0000 mL | Freq: Once | INTRAMUSCULAR | Status: AC | PRN
  Administered 2021-11-13: 100 mL via INTRAVENOUS

## 2021-11-13 MED ORDER — SODIUM CHLORIDE 0.9 % IV BOLUS
1000.0000 mL | Freq: Once | INTRAVENOUS | Status: AC
  Administered 2021-11-13: 1000 mL via INTRAVENOUS

## 2021-11-13 MED ORDER — KETOROLAC TROMETHAMINE 30 MG/ML IJ SOLN
30.0000 mg | Freq: Once | INTRAMUSCULAR | Status: AC
  Administered 2021-11-13: 30 mg via INTRAVENOUS
  Filled 2021-11-13: qty 1

## 2021-11-13 NOTE — ED Triage Notes (Signed)
Pt reports abd pain for the last 7 days. Pt states started with chills, NV and now has severe abd pain and diarrhea.

## 2021-11-13 NOTE — ED Notes (Signed)
Pt in bed, pt states that he is ready to go home, pt verbalized understanding d/c and follow up, pt expressed thanks for care received, pt ambulatory from dpt.

## 2021-11-13 NOTE — ED Provider Notes (Signed)
[facility] Provider Note   Event Date/Time   First MD Initiated Contact with Patient 11/13/21 1420     (approximate) History  Abdominal Pain and Diarrhea  HPI Ryan Price. is a 61 y.o. male  Location: Generalized abdomen Duration: 7 days prior to arrival Timing: Worsening since onset Severity: 8/10 Quality: Aching pain Context: Patient states that 70s prior to arrival he began having nausea/vomiting/diarrhea with associated generalized abdominal pain Modifying factors: Any p.o. intake worsens his diarrhea and abdominal pain and he denies any relieving factors Associated Symptoms: Nausea/vomiting/diarrhea ROS: Patient currently denies any vision changes, tinnitus, difficulty speaking, facial droop, sore throat, chest pain, shortness of breath, dysuria, or weakness/numbness/paresthesias in any extremity   Physical Exam  Triage Vital Signs: ED Triage Vitals  Enc Vitals Group     BP 11/13/21 1243 106/73     Pulse Rate 11/13/21 1243 87     Resp 11/13/21 1243 20     Temp 11/13/21 1243 98.4 F (36.9 C)     Temp Source 11/13/21 1243 Oral     SpO2 11/13/21 1243 99 %     Weight 11/13/21 1241 251 lb (113.9 kg)     Height 11/13/21 1241 '6\' 2"'$  (1.88 m)     Head Circumference --      Peak Flow --      Pain Score 11/13/21 1241 8     Pain Loc --      Pain Edu? --      Excl. in [place]? --    Most recent vital signs: Vitals:   11/13/21 1243 11/13/21 1440  BP: 106/73 105/64  Pulse: 87 75  Resp: 20 18  Temp: 98.4 F (36.9 C) 98.4 F (36.9 C)  SpO2: 99% 96%   General: Awake, oriented x4. CV:  Good peripheral perfusion.  Resp:  Normal effort.  Abd:  No distention.  Generalized tenderness to palpation Other:  Middle-aged Caucasian male laying in bed in no acute distress ED Results / Procedures / Treatments  Labs (all labs ordered are listed, but only abnormal results are displayed) Labs Reviewed  COMPREHENSIVE METABOLIC PANEL - Abnormal; Notable for  the following components:      Result Value   Glucose, Bld 113 (*)    All other components within normal limits  URINALYSIS, ROUTINE W REFLEX MICROSCOPIC - Abnormal; Notable for the following components:   Color, Urine AMBER (*)    APPearance HAZY (*)    Protein, ur 30 (*)    Bacteria, UA RARE (*)    All other components within normal limits  LIPASE, BLOOD  CBC  RADIOLOGY ED MD interpretation: Pending -Agree with radiology assessment Official radiology report(s): No results found. PROCEDURES: Critical Care performed: No .1-3 Lead EKG Interpretation  Performed by: Naaman Plummer, MD Authorized by: Naaman Plummer, MD     Interpretation: normal     ECG rate:  74   ECG rate assessment: normal     Rhythm: sinus rhythm     Ectopy: none     Conduction: normal    MEDICATIONS ORDERED IN ED: Medications  ketorolac (TORADOL) 30 MG/ML injection 30 mg (30 mg Intravenous Given 11/13/21 1459)  morphine (PF) 4 MG/ML injection 4 mg (4 mg Intravenous Given 11/13/21 1457)  sodium chloride 0.9 % bolus 1,000 mL (1,000 mLs Intravenous New Bag/Given 11/13/21 1456)   IMPRESSION / MDM / ASSESSMENT AND PLAN / ED COURSE  I reviewed the triage vital signs and the nursing  notes.                             The patient is on the cardiac monitor to evaluate for evidence of arrhythmia and/or significant heart rate changes. Patient's presentation is most consistent with acute presentation with potential threat to life or bodily function. Patient presents for abdominal pain.  Differential diagnosis includes appendicitis, abdominal aortic aneurysm, surgical biliary disease, pancreatitis, SBO, mesenteric ischemia, serious intra-abdominal bacterial illness, genital torsion. Doubt atypical ACS. Patient shows no signs of AKI at this time CT of the abdomen pelvis with IV contrast pending at the time of signout Care of this patient will be signed out to the oncoming physician at the end of my shift.  All pertinent  patient information conveyed and all questions answered.  All further care and disposition decisions will be made by the oncoming physician.   FINAL CLINICAL IMPRESSION(S) / ED DIAGNOSES   Final diagnoses:  Generalized abdominal pain  Diarrhea, unspecified type  Nausea and vomiting, unspecified vomiting type   Rx / DC Orders   ED Discharge Orders     None      Note:  This document was prepared using Dragon voice recognition software and may include unintentional dictation errors.   Naaman Plummer, MD 11/13/21 [phone]

## 2021-11-13 NOTE — Discharge Instructions (Signed)
Please seek medical attention for any high fevers, chest pain, shortness of breath, change in behavior, persistent vomiting, bloody stool or any other new or concerning symptoms.  

## 2021-11-13 NOTE — ED Notes (Signed)
Pt back from ct, pt reports decreased pain, states that his pain is a 4/10, states that he doesn't need anything for pain at this time

## 2021-11-13 NOTE — ED Notes (Signed)
Pt in bed, pt states that he doesn't need anything at this time.

## 2023-01-09 ENCOUNTER — Other Ambulatory Visit: Payer: Self-pay | Admitting: Internal Medicine

## 2023-01-09 DIAGNOSIS — R600 Localized edema: Secondary | ICD-10-CM

## 2023-01-25 ENCOUNTER — Ambulatory Visit: Payer: 59 | Attending: Internal Medicine

## 2023-04-11 ENCOUNTER — Ambulatory Visit: Payer: 59

## 2023-07-06 ENCOUNTER — Other Ambulatory Visit: Payer: Self-pay | Admitting: Internal Medicine

## 2023-07-06 DIAGNOSIS — M4802 Spinal stenosis, cervical region: Secondary | ICD-10-CM

## 2023-07-15 ENCOUNTER — Ambulatory Visit
Admission: RE | Admit: 2023-07-15 | Discharge: 2023-07-15 | Disposition: A | Discharge status: Home / Self Care | Payer: 59 | Source: Ambulatory Visit | Attending: Internal Medicine | Admitting: Internal Medicine

## 2023-07-15 DIAGNOSIS — M4802 Spinal stenosis, cervical region: Secondary | ICD-10-CM | POA: Diagnosis present

## 2023-08-06 ENCOUNTER — Other Ambulatory Visit: Payer: Self-pay

## 2023-09-27 ENCOUNTER — Encounter: Payer: Self-pay | Admitting: Internal Medicine

## 2023-10-09 ENCOUNTER — Ambulatory Visit

## 2023-10-09 ENCOUNTER — Ambulatory Visit
Admission: RE | Admit: 2023-10-09 | Discharge: 2023-10-09 | Disposition: A | Discharge status: Home / Self Care | Attending: Internal Medicine | Admitting: Internal Medicine

## 2023-10-09 ENCOUNTER — Encounter
Admission: RE | Disposition: A | Discharge status: Home / Self Care | Payer: Self-pay | Source: Home / Self Care | Attending: Internal Medicine

## 2023-10-09 ENCOUNTER — Encounter: Payer: Self-pay | Admitting: Internal Medicine

## 2023-10-09 ENCOUNTER — Other Ambulatory Visit: Payer: Self-pay

## 2023-10-09 DIAGNOSIS — J449 Chronic obstructive pulmonary disease, unspecified: Secondary | ICD-10-CM | POA: Diagnosis not present

## 2023-10-09 DIAGNOSIS — R0602 Shortness of breath: Secondary | ICD-10-CM | POA: Insufficient documentation

## 2023-10-09 DIAGNOSIS — K635 Polyp of colon: Secondary | ICD-10-CM | POA: Insufficient documentation

## 2023-10-09 DIAGNOSIS — I10 Essential (primary) hypertension: Secondary | ICD-10-CM | POA: Insufficient documentation

## 2023-10-09 DIAGNOSIS — Z7951 Long term (current) use of inhaled steroids: Secondary | ICD-10-CM | POA: Diagnosis not present

## 2023-10-09 DIAGNOSIS — Z79899 Other long term (current) drug therapy: Secondary | ICD-10-CM | POA: Diagnosis not present

## 2023-10-09 DIAGNOSIS — F17200 Nicotine dependence, unspecified, uncomplicated: Secondary | ICD-10-CM | POA: Insufficient documentation

## 2023-10-09 DIAGNOSIS — F32A Depression, unspecified: Secondary | ICD-10-CM | POA: Diagnosis not present

## 2023-10-09 DIAGNOSIS — Z1211 Encounter for screening for malignant neoplasm of colon: Secondary | ICD-10-CM | POA: Insufficient documentation

## 2023-10-09 DIAGNOSIS — K219 Gastro-esophageal reflux disease without esophagitis: Secondary | ICD-10-CM | POA: Diagnosis not present

## 2023-10-09 HISTORY — DX: Ganglion, left wrist: M67.432

## 2023-10-09 HISTORY — DX: Unspecified fracture of shaft of left ulna, initial encounter for closed fracture: S52.202A

## 2023-10-09 HISTORY — DX: Other intervertebral disc degeneration, lumbar region without mention of lumbar back pain or lower extremity pain: M51.369

## 2023-10-09 HISTORY — DX: Chronic obstructive pulmonary disease, unspecified: J44.9

## 2023-10-09 HISTORY — DX: Other injury of unspecified body region, initial encounter: T14.8XXA

## 2023-10-09 HISTORY — PX: POLYPECTOMY: SHX149

## 2023-10-09 HISTORY — DX: Ganglion, right wrist: M67.431

## 2023-10-09 HISTORY — DX: Other cervical disc degeneration, unspecified cervical region: M50.30

## 2023-10-09 HISTORY — DX: Sciatica, left side: M54.32

## 2023-10-09 HISTORY — DX: Hyperlipidemia, unspecified: E78.5

## 2023-10-09 HISTORY — DX: Puncture wound without foreign body of left shoulder, initial encounter: S41.032A

## 2023-10-09 HISTORY — PX: COLONOSCOPY: SHX5424

## 2023-10-09 SURGERY — COLONOSCOPY
Anesthesia: General

## 2023-10-09 MED ORDER — PROPOFOL 500 MG/50ML IV EMUL
INTRAVENOUS | Status: DC | PRN
  Administered 2023-10-09: 150 ug/kg/min via INTRAVENOUS
  Administered 2023-10-09: 100 mg via INTRAVENOUS

## 2023-10-09 MED ORDER — SODIUM CHLORIDE 0.9 % IV SOLN: INTRAVENOUS | Status: DC

## 2023-10-09 NOTE — Anesthesia Postprocedure Evaluation (Signed)
 Anesthesia Post Note  Patient: Ryan Price.  Procedure(s) Performed: COLONOSCOPY POLYPECTOMY, INTESTINE  Patient location during evaluation: PACU Anesthesia Type: General Level of consciousness: awake and alert Pain management: pain level controlled Vital Signs Assessment: post-procedure vital signs reviewed and stable Respiratory status: spontaneous breathing, nonlabored ventilation, respiratory function stable and patient connected to nasal cannula oxygen Cardiovascular status: blood pressure returned to baseline and stable Postop Assessment: no apparent nausea or vomiting Anesthetic complications: no   There were no known notable events for this encounter.   Last Vitals:  Vitals:   10/09/23 1208 10/09/23 1218  BP: (!) 172/94 (!) 170/90  Pulse:    Resp:    Temp:    SpO2:      Last Pain:  Vitals:   10/09/23 1218  TempSrc:   PainSc: 0-No pain                 Portia Brittle Ethon Wymer

## 2023-10-09 NOTE — Op Note (Signed)
 [facility] Gastroenterology Patient Name: Ryan Price Procedure Date: 10/09/2023 11:30 AM MRN: [id number] Account #: [id number] Date of Birth: 12-02-1960 Admit Type: Ambulatory Age: 63 Room: [facility] ENDO ROOM 1 Gender: Male Note Status: Finalized Instrument Name: Charlyn Cooley [id number] Procedure:             Colonoscopy Indications:           High risk colon cancer surveillance: Personal history                         of non-advanced adenoma Providers:             Avir Deruiter K. Virgia Kelner MD, MD Medicines:             Propofol  per Anesthesia Procedure:             Pre-Anesthesia Assessment:                        - The risks and benefits of the procedure and the                         sedation options and risks were discussed with the                         patient. All questions were answered and informed                         consent was obtained.                        - Patient identification and proposed procedure were                         verified prior to the procedure by the nurse. The                         procedure was verified in the procedure room.                        - ASA Grade Assessment: II - A patient with mild                         systemic disease.                        - After reviewing the risks and benefits, the patient                         was deemed in satisfactory condition to undergo the                         procedure.                        After obtaining informed consent, the colonoscope was                         passed under direct vision. Throughout the procedure,  the patient's blood pressure, pulse, and oxygen                         saturations were monitored continuously. The                         Colonoscope was introduced through the anus and                         advanced to the the cecum, identified by appendiceal                         orifice and ileocecal valve. The colonoscopy  was                         performed without difficulty. The patient tolerated                         the procedure well. The quality of the bowel                         preparation was good. The ileocecal valve, appendiceal                         orifice, and rectum were photographed. Findings:      The perianal and digital rectal examinations were normal. Pertinent       negatives include normal sphincter tone and no palpable rectal lesions.      A 4 mm polyp was found in the sigmoid colon. The polyp was sessile. The       polyp was removed with a piecemeal technique using a cold biopsy       forceps. Resection and retrieval were complete. Estimated blood loss was       minimal.      The exam was otherwise without abnormality on direct and retroflexion       views. Impression:            - One 4 mm polyp in the sigmoid colon, removed                         piecemeal using a cold biopsy forceps. Resected and                         retrieved.                        - The examination was otherwise normal on direct and                         retroflexion views. Recommendation:        - Patient has a contact number available for                         emergencies. The signs and symptoms of potential                         delayed complications were discussed with the patient.  Return to normal activities tomorrow. Written                         discharge instructions were provided to the patient.                        - Resume previous diet.                        - Continue present medications.                        - Repeat colonoscopy is recommended for surveillance.                         The colonoscopy date will be determined after                         pathology results from today's exam become available                         for review.                        - Return to GI office PRN. Procedure Code(s):     --- Professional ---                         [phone], Colonoscopy, flexible; with biopsy, single or                         multiple Diagnosis Code(s):     --- Professional ---                        D12.5, Benign neoplasm of sigmoid colon                        Z86.010, Personal history of colonic polyps CPT copyright 2022 [employer]. All rights reserved. The codes documented in this report are preliminary and upon coder review may  be revised to meet current compliance requirements. Cassie Click MD, MD 10/09/2023 11:57:57 AM This report has been signed electronically. Number of Addenda: 0 Note Initiated On: 10/09/2023 11:30 AM Scope Withdrawal Time: 0 hours 6 minutes 56 seconds  Total Procedure Duration: 0 hours 9 minutes 32 seconds  Estimated Blood Loss:  Estimated blood loss: none. Estimated blood loss was                         minimal.      [facility]

## 2023-10-09 NOTE — Transfer of Care (Signed)
 Immediate Anesthesia Transfer of Care Note  Patient: Ryan Price.  Procedure(s) Performed: COLONOSCOPY POLYPECTOMY, INTESTINE  Patient Location: Endoscopy Unit  Anesthesia Type:General  Level of Consciousness: awake  Airway & Oxygen Therapy: Patient Spontanous Breathing  Post-op Assessment: Report given to RN and Post -op Vital signs reviewed and stable  Post vital signs: Reviewed and stable  Last Vitals:  Vitals Value Taken Time  BP 133/85 10/09/23 1158  Temp    Pulse 74 10/09/23 1159  Resp 16 10/09/23 1159  SpO2 98 % 10/09/23 1159  Vitals shown include unfiled device data.  Last Pain:  Vitals:   10/09/23 1017  TempSrc: Temporal  PainSc: 0-No pain         Complications: There were no known notable events for this encounter.
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
## 2023-10-10 ENCOUNTER — Encounter: Payer: Self-pay | Admitting: Internal Medicine

## 2023-10-10 LAB — SURGICAL PATHOLOGY
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
## 2023-10-17 ENCOUNTER — Ambulatory Visit: Admitting: Orthopedic Surgery

## 2023-10-17 ENCOUNTER — Encounter: Payer: Self-pay | Admitting: Orthopedic Surgery

## 2023-10-17 VITALS — BP 118/78 | Ht 75.0 in | Wt 259.0 lb

## 2023-10-17 DIAGNOSIS — G8929 Other chronic pain: Secondary | ICD-10-CM

## 2023-10-17 DIAGNOSIS — M47812 Spondylosis without myelopathy or radiculopathy, cervical region: Secondary | ICD-10-CM | POA: Diagnosis not present

## 2023-10-17 DIAGNOSIS — M545 Low back pain, unspecified: Secondary | ICD-10-CM | POA: Diagnosis not present

## 2023-10-17 DIAGNOSIS — M546 Pain in thoracic spine: Secondary | ICD-10-CM | POA: Diagnosis not present

## 2023-10-17 NOTE — Patient Instructions (Signed)
 It was so nice to see you today. Thank you so much for coming in.    You have some wear and tear (arthritis) in your neck. This is likely causing your neck pain.   I want to get an MRI of your lower back to look into things further. We will get this approved through your insurance and [facility] will call you to schedule the appointment. Ask about your patient responsibility. You do not need to pay this prior to getting MRI, they can bill you.   When you get the MRI, I want you to get xrays of your mid and lower back as well. Please remind them.   [facility] (building with the white pillars) is located off of [place]. The address is [address] [place], [place] [address].    After you have the MRI, it takes 14-21 days for me to get the results back. Once I have them, we will call you to schedule a follow up phone visit with me to review them.   Please do not hesitate to call if you have any questions or concerns. You can also message me in MyChart.   Lucetta Russel PA-C [phone]     The physicians and staff at [facility] at [facility] are committed to providing excellent care. You may receive a survey asking for feedback about your experience at our office. We value you your feedback and appreciate you taking the time to to fill it out. The [facility] leadership team is also available to discuss your experience in person, feel free to contact us  [phone].

## 2023-10-24 ENCOUNTER — Ambulatory Visit
Admission: RE | Admit: 2023-10-24 | Discharge: 2023-10-24 | Disposition: A | Discharge status: Home / Self Care | Source: Ambulatory Visit | Attending: Orthopedic Surgery | Admitting: Orthopedic Surgery

## 2023-10-24 ENCOUNTER — Ambulatory Visit

## 2023-10-24 DIAGNOSIS — G8929 Other chronic pain: Secondary | ICD-10-CM
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
## 2023-11-15 ENCOUNTER — Ambulatory Visit (INDEPENDENT_AMBULATORY_CARE_PROVIDER_SITE_OTHER): Admitting: Orthopedic Surgery

## 2023-11-15 ENCOUNTER — Encounter: Payer: Self-pay | Admitting: Orthopedic Surgery

## 2023-11-15 DIAGNOSIS — M4802 Spinal stenosis, cervical region: Secondary | ICD-10-CM

## 2023-11-15 DIAGNOSIS — M47816 Spondylosis without myelopathy or radiculopathy, lumbar region: Secondary | ICD-10-CM | POA: Diagnosis not present

## 2023-11-15 DIAGNOSIS — M48061 Spinal stenosis, lumbar region without neurogenic claudication: Secondary | ICD-10-CM

## 2023-11-15 DIAGNOSIS — M47812 Spondylosis without myelopathy or radiculopathy, cervical region: Secondary | ICD-10-CM | POA: Diagnosis not present

## 2023-11-15 DIAGNOSIS — G8929 Other chronic pain: Secondary | ICD-10-CM
